# Patient Record
Sex: Female | Born: 1990 | Race: White | Hispanic: No | Marital: Married | Smoking: Never smoker
Health system: Southern US, Community
[De-identification: ages and names within clinical notes are randomized; demographics above are authoritative.]

## PROBLEM LIST (undated history)

## (undated) ENCOUNTER — Inpatient Hospital Stay (HOSPITAL_COMMUNITY): Payer: Self-pay

## (undated) DIAGNOSIS — F419 Anxiety disorder, unspecified: Secondary | ICD-10-CM

## (undated) DIAGNOSIS — E282 Polycystic ovarian syndrome: Secondary | ICD-10-CM

## (undated) DIAGNOSIS — H49819 Kearns-Sayre syndrome, unspecified eye: Secondary | ICD-10-CM

## (undated) DIAGNOSIS — E8840 Mitochondrial metabolism disorder, unspecified: Secondary | ICD-10-CM

## (undated) HISTORY — DX: Kearns-Sayre syndrome, unspecified eye: H49.819

## (undated) HISTORY — DX: Polycystic ovarian syndrome: E28.2

## (undated) HISTORY — PX: OTHER SURGICAL HISTORY: SHX169

## (undated) HISTORY — PX: WISDOM TOOTH EXTRACTION: SHX21

## (undated) HISTORY — DX: Mitochondrial metabolism disorder, unspecified: E88.40

## (undated) HISTORY — PX: TONSILLECTOMY: SUR1361

## (undated) HISTORY — DX: Anxiety disorder, unspecified: F41.9

---
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
## 2013-09-20 ENCOUNTER — Emergency Department (INDEPENDENT_AMBULATORY_CARE_PROVIDER_SITE_OTHER): Payer: 59

## 2013-09-20 ENCOUNTER — Encounter (HOSPITAL_COMMUNITY): Payer: Self-pay | Admitting: Emergency Medicine

## 2013-09-20 ENCOUNTER — Emergency Department (INDEPENDENT_AMBULATORY_CARE_PROVIDER_SITE_OTHER)
Admission: EM | Admit: 2013-09-20 | Discharge: 2013-09-20 | Disposition: A | Discharge status: Home / Self Care | Payer: 59 | Source: Home / Self Care | Attending: Family Medicine | Admitting: Family Medicine

## 2013-09-20 DIAGNOSIS — IMO0002 Reserved for concepts with insufficient information to code with codable children: Secondary | ICD-10-CM

## 2013-09-20 DIAGNOSIS — Y93K1 Activity, walking an animal: Secondary | ICD-10-CM

## 2013-09-20 DIAGNOSIS — S6000XA Contusion of unspecified finger without damage to nail, initial encounter: Secondary | ICD-10-CM

## 2013-09-20 MED ORDER — TETANUS-DIPHTHERIA TOXOIDS TD 5-2 LFU IM INJ
INJECTION | INTRAMUSCULAR | Status: AC
  Filled 2013-09-20: qty 0.5

## 2013-09-20 MED ORDER — TETANUS-DIPHTHERIA TOXOIDS TD 5-2 LFU IM INJ
0.5000 mL | INJECTION | Freq: Once | INTRAMUSCULAR | Status: AC
  Administered 2013-09-20: 0.5 mL via INTRAMUSCULAR

## 2013-09-20 NOTE — Discharge Instructions (Signed)
Wash regularly, tape to adjacent finger for comfort , return as needed.

## 2013-09-20 NOTE — ED Provider Notes (Signed)
CSN: [id number]     Arrival date & time 09/20/13  1406 History   First MD Initiated Contact with Patient 09/20/13 1522     Chief Complaint  Patient presents with  . Finger Injury   (Consider location/radiation/quality/duration/timing/severity/associated sxs/prior Treatment) Patient is a 23 y.o. female presenting with hand pain. The history is provided by the patient.  Hand Pain This is a new problem. The current episode started 2 days ago (pt's dog ran her into a brick wall on sat, continues with pain and sts.). The problem has been gradually worsening.    History reviewed. No pertinent past medical history. History reviewed. No pertinent past surgical history. History reviewed. No pertinent family history. History  Substance Use Topics  . Smoking status: Never Smoker   . Smokeless tobacco: Not on file  . Alcohol Use: Yes   OB History   Grav Para Term Preterm Abortions TAB SAB Ect Mult Living                 Review of Systems  Constitutional: Negative.   Musculoskeletal: Positive for joint swelling.  Skin: Positive for wound.    Allergies  Pertussis vaccines  Home Medications   Prior to Admission medications   Medication Sig Start Date End Date Taking? Authorizing Provider  FLUoxetine (PROZAC) 40 MG capsule Take 40 mg by mouth daily.   Yes Historical Provider, MD   BP 112/76  Pulse 66  Temp(Src) 98.2 F (36.8 C) (Oral)  Resp 16  SpO2 98%  LMP 09/07/2013 Physical Exam  Nursing note and vitals reviewed. Constitutional: She is oriented to person, place, and time. She appears well-developed and well-nourished.  Neurological: She is alert and oriented to person, place, and time.  Skin: Skin is warm and dry.  Superficial abrasion over dorsum of finger , ulnar dev and pain on rom.    ED Course  Procedures (including critical care time) Labs Review Labs Reviewed - No data to display  Imaging Review Dg Finger Little Right  09/20/2013   CLINICAL DATA:  Finger  pain/swelling  EXAM: RIGHT LITTLE FINGER 2+V  COMPARISON:  None.  FINDINGS: No fracture or dislocation is seen.  The joint spaces are preserved.  The visualized soft tissues are unremarkable.  IMPRESSION: No fracture or dislocation is seen.   Electronically Signed   By: Charline BillsSriyesh  Krishnan M.D.   On: 09/20/2013 16:06    X-rays reviewed and report per radiologist.  MDM   1. Contusion of finger of right hand        Linna HoffJames D Latara Micheli, MD 09/20/13 2143

## 2013-09-20 NOTE — ED Notes (Signed)
Injury to right fifth finger on Saturday, when her dog on leash ran her into a brick wall. Concern for fracture, as she has limited ROM

## 2013-10-26 ENCOUNTER — Emergency Department (HOSPITAL_COMMUNITY): Payer: 59

## 2013-10-26 ENCOUNTER — Emergency Department (HOSPITAL_COMMUNITY)
Admission: EM | Admit: 2013-10-26 | Discharge: 2013-10-26 | Disposition: A | Discharge status: Home / Self Care | Payer: 59 | Source: Home / Self Care | Attending: Emergency Medicine | Admitting: Emergency Medicine

## 2013-10-26 ENCOUNTER — Encounter (HOSPITAL_COMMUNITY): Payer: Self-pay | Admitting: Emergency Medicine

## 2013-10-26 DIAGNOSIS — J020 Streptococcal pharyngitis: Secondary | ICD-10-CM

## 2013-10-26 LAB — POCT URINALYSIS DIP (DEVICE)
Bilirubin Urine: NEGATIVE
Glucose, UA: NEGATIVE mg/dL
Hgb urine dipstick: NEGATIVE
KETONES UR: NEGATIVE mg/dL
Leukocytes, UA: NEGATIVE
Nitrite: NEGATIVE
PH: 7 (ref 5.0–8.0)
Protein, ur: NEGATIVE mg/dL
SPECIFIC GRAVITY, URINE: 1.02 (ref 1.005–1.030)
Urobilinogen, UA: 0.2 mg/dL (ref 0.0–1.0)

## 2013-10-26 LAB — POCT INFECTIOUS MONO SCREEN: Mono Screen: NEGATIVE

## 2013-10-26 LAB — POCT RAPID STREP A: STREPTOCOCCUS, GROUP A SCREEN (DIRECT): POSITIVE — AB

## 2013-10-26 LAB — POCT PREGNANCY, URINE: Preg Test, Ur: NEGATIVE

## 2013-10-26 MED ORDER — PREDNISONE 20 MG PO TABS: 20.0000 mg | ORAL_TABLET | Freq: Two times a day (BID) | ORAL | Status: DC

## 2013-10-26 MED ORDER — AZITHROMYCIN 500 MG PO TABS: 500.0000 mg | ORAL_TABLET | Freq: Every day | ORAL | Status: DC

## 2013-10-26 NOTE — ED Notes (Signed)
Pt c/o cold sx onset 1 week Sx include: fatigue, BA, nauseas, ST, chest tightness/congestion, HA Denies f/v/d Alert w/no signs of acute distress

## 2013-10-26 NOTE — Discharge Instructions (Signed)
Strep Throat  Strep throat is an infection of the throat caused by a bacteria named Streptococcus pyogenes. Your caregiver may call the infection streptococcal "tonsillitis" or "pharyngitis" depending on whether there are signs of inflammation in the tonsils or back of the throat. Strep throat is most common in children aged 23 15 years during the cold months of the year, but it can occur in people of any age during any season. This infection is spread from person to person (contagious) through coughing, sneezing, or other close contact.  SYMPTOMS   · Fever or chills.  · Painful, swollen, red tonsils or throat.  · Pain or difficulty when swallowing.  · White or yellow spots on the tonsils or throat.  · Swollen, tender lymph nodes or "glands" of the neck or under the jaw.  · Red rash all over the body (rare).  DIAGNOSIS   Many different infections can cause the same symptoms. A test must be done to confirm the diagnosis so the right treatment can be given. A "rapid strep test" can help your caregiver make the diagnosis in a few minutes. If this test is not available, a light swab of the infected area can be used for a throat culture test. If a throat culture test is done, results are usually available in a day or two.  TREATMENT   Strep throat is treated with antibiotic medicine.  HOME CARE INSTRUCTIONS   · Gargle with 1 tsp of salt in 1 cup of warm water, 3 4 times per day or as needed for comfort.  · Family members who also have a sore throat or fever should be tested for strep throat and treated with antibiotics if they have the strep infection.  · Make sure everyone in your household washes their hands well.  · Do not share food, drinking cups, or personal items that could cause the infection to spread to others.  · You may need to eat a soft food diet until your sore throat gets better.  · Drink enough water and fluids to keep your urine clear or pale yellow. This will help prevent dehydration.  · Get plenty of  rest.  · Stay home from school, daycare, or work until you have been on antibiotics for 24 hours.  · Only take over-the-counter or prescription medicines for pain, discomfort, or fever as directed by your caregiver.  · If antibiotics are prescribed, take them as directed. Finish them even if you start to feel better.  SEEK MEDICAL CARE IF:   · The glands in your neck continue to enlarge.  · You develop a rash, cough, or earache.  · You cough up green, yellow-brown, or bloody sputum.  · You have pain or discomfort not controlled by medicines.  · Your problems seem to be getting worse rather than better.  SEEK IMMEDIATE MEDICAL CARE IF:   · You develop any new symptoms such as vomiting, severe headache, stiff or painful neck, chest pain, shortness of breath, or trouble swallowing.  · You develop severe throat pain, drooling, or changes in your voice.  · You develop swelling of the neck, or the skin on the neck becomes red and tender.  · You have a fever.  · You develop signs of dehydration, such as fatigue, dry mouth, and decreased urination.  · You become increasingly sleepy, or you cannot wake up completely.  Document Released: 05/03/2000 Document Revised: 04/22/2012 Document Reviewed: 07/05/2010  [employer]® Patient Information ©2014 [employer].

## 2013-10-26 NOTE — ED Provider Notes (Signed)
Chief Complaint   Chief Complaint  Patient presents with  . URI    History of Present Illness   Sheryl Wright is a 23 year old female who's been sick for the past week feeling achy all over, tired, chills, and dizzy. She's had headaches and migraines, and she's felt nauseated and had a sore throat. Her last menstrual period was May 25th. She took a pregnancy test at home which was positive, but then went to her gynecologist yesterday had a negative serum pregnancy test. She's had nasal congestion with greenish drainage, chest feels tight and she's been short of breath. She's had a slight cough with clear to yellow drainage, chest tightness, crampy abdominal pain, and some diarrhea. Her husband has Mono.  Review of Systems   Other than as noted above, the patient denies any of the following symptoms: Systemic:  No fevers, chills, sweats, or myalgias. Eye:  No redness or discharge. ENT:  No ear pain, headache, nasal congestion, drainage, sinus pressure, or sore throat. Neck:  No neck pain, stiffness, or swollen glands. Lungs:  No cough, sputum production, hemoptysis, wheezing, chest tightness, shortness of breath or chest pain. GI:  No abdominal pain, nausea, vomiting or diarrhea.  PMFSH   Past medical history, family history, social history, meds, and allergies were reviewed. She is allergic to penicillin and pertussis vaccine. She takes Prozac.  Physical exam   Vital signs:  BP 111/69  Pulse 92  Temp(Src) 98.4 F (36.9 C) (Oral)  Resp 16  SpO2 98%  LMP 10/11/2013 General:  Alert and oriented.  In no distress.  Skin warm and dry. Eye:  No conjunctival injection or drainage. Lids were normal. ENT:  TMs and canals were normal, without erythema or inflammation.  Nasal mucosa was clear and uncongested, without drainage.  Mucous membranes were moist.  Pharynx was erythematous with some yellowish exudate on the tonsillar pillars. Her tonsils are surgically absent.  There were no  oral ulcerations or lesions. Neck:  Supple, no adenopathy, tenderness or mass. Lungs:  No respiratory distress.  Lungs were clear to auscultation, without wheezes, rales or rhonchi.  Breath sounds were clear and equal bilaterally.  Heart:  Regular rhythm, without gallops, murmers or rubs. Skin:  Clear, warm, and dry, without rash or lesions.  Labs   Results for orders placed during the hospital encounter of 10/26/13  POCT URINALYSIS DIP (DEVICE)      Result Value Ref Range   Glucose, UA NEGATIVE  NEGATIVE mg/dL   Bilirubin Urine NEGATIVE  NEGATIVE   Ketones, ur NEGATIVE  NEGATIVE mg/dL   Specific Gravity, Urine 1.020  1.005 - 1.030   Hgb urine dipstick NEGATIVE  NEGATIVE   pH 7.0  5.0 - 8.0   Protein, ur NEGATIVE  NEGATIVE mg/dL   Urobilinogen, UA 0.2  0.0 - 1.0 mg/dL   Nitrite NEGATIVE  NEGATIVE   Leukocytes, UA NEGATIVE  NEGATIVE  POCT PREGNANCY, URINE      Result Value Ref Range   Preg Test, Ur NEGATIVE  NEGATIVE  POCT RAPID STREP A ([facility]ARE ONLY)      Result Value Ref Range   Streptococcus, Group A Screen (Direct) POSITIVE (*) NEGATIVE  POCT INFECTIOUS MONO SCREEN      Result Value Ref Range   Mono Screen NEGATIVE  NEGATIVE    Assessment     The encounter diagnosis was Strep throat.  Plan    1.  Meds:  The following meds were prescribed:   New Prescriptions  AZITHROMYCIN (ZITHROMAX) 500 MG TABLET    Take 1 tablet (500 mg total) by mouth daily.   PREDNISONE (DELTASONE) 20 MG TABLET    Take 1 tablet (20 mg total) by mouth 2 (two) times daily.    2.  Patient Education/Counseling:  The patient was given appropriate handouts, self care instructions, and instructed in symptomatic relief.  Instructed to get extra fluids, rest, and use a cool mist vaporizer.    3.  Follow up:  The patient was told to follow up here if no better in 3 to 4 days, or sooner if becoming worse in any way, and given some red flag symptoms such as increasing fever, difficulty breathing, chest  pain, or persistent vomiting which would prompt immediate return.  Follow up here as needed.      Reuben Likes, MD 10/26/13 1053

## 2013-11-01 ENCOUNTER — Encounter (HOSPITAL_COMMUNITY): Payer: Self-pay | Admitting: Emergency Medicine

## 2013-11-01 ENCOUNTER — Emergency Department (INDEPENDENT_AMBULATORY_CARE_PROVIDER_SITE_OTHER)
Admission: EM | Admit: 2013-11-01 | Discharge: 2013-11-01 | Disposition: A | Discharge status: Home / Self Care | Payer: 59 | Source: Home / Self Care | Attending: Family Medicine | Admitting: Family Medicine

## 2013-11-01 DIAGNOSIS — R5381 Other malaise: Secondary | ICD-10-CM

## 2013-11-01 DIAGNOSIS — R5383 Other fatigue: Secondary | ICD-10-CM

## 2013-11-01 LAB — POCT URINALYSIS DIP (DEVICE)
Bilirubin Urine: NEGATIVE
GLUCOSE, UA: NEGATIVE mg/dL
Hgb urine dipstick: NEGATIVE
KETONES UR: NEGATIVE mg/dL
Nitrite: NEGATIVE
Protein, ur: NEGATIVE mg/dL
SPECIFIC GRAVITY, URINE: 1.02 (ref 1.005–1.030)
UROBILINOGEN UA: 0.2 mg/dL (ref 0.0–1.0)
pH: 6 (ref 5.0–8.0)

## 2013-11-01 LAB — POCT I-STAT, CHEM 8
BUN: 15 mg/dL (ref 6–23)
CALCIUM ION: 1.23 mmol/L (ref 1.12–1.23)
CHLORIDE: 99 meq/L (ref 96–112)
Creatinine, Ser: 0.9 mg/dL (ref 0.50–1.10)
Glucose, Bld: 85 mg/dL (ref 70–99)
HEMATOCRIT: 41 % (ref 36.0–46.0)
Hemoglobin: 13.9 g/dL (ref 12.0–15.0)
POTASSIUM: 4.2 meq/L (ref 3.7–5.3)
Sodium: 141 mEq/L (ref 137–147)
TCO2: 28 mmol/L (ref 0–100)

## 2013-11-01 LAB — POCT INFECTIOUS MONO SCREEN: MONO SCREEN: NEGATIVE

## 2013-11-01 LAB — POCT PREGNANCY, URINE: Preg Test, Ur: NEGATIVE

## 2013-11-01 NOTE — ED Notes (Signed)
C/o fatigue and upper body ache States she did have strep last Wednesday Tried to sleep more and used ibuprofen as tx

## 2013-11-01 NOTE — ED Provider Notes (Signed)
CSN: [id number]     Arrival date & time 11/01/13  1553 History   First MD Initiated Contact with Patient 11/01/13 1557     Chief Complaint  Patient presents with  . Fatigue   (Consider location/radiation/quality/duration/timing/severity/associated sxs/prior Treatment) HPI Comments: Patient presents with a 2 week history of fatigue and myalgias. States she was treated with azithromycin last week for strep pharyngitis. States her skin feels sore to the touch but that she has no rashes.  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    History reviewed. No pertinent past medical history. History reviewed. No pertinent past surgical history. History reviewed. No pertinent family history. History  Substance Use Topics  . Smoking status: Never Smoker   . Smokeless tobacco: Not on file  . Alcohol Use: Yes   OB History   Grav Para Term Preterm Abortions TAB SAB Ect Mult Living                 Review of Systems  Constitutional: Positive for fatigue. Negative for fever, chills, diaphoresis, activity change, appetite change and unexpected weight change.  HENT: Negative.   Eyes: Negative.   Respiratory: Negative.   Cardiovascular: Negative.   Gastrointestinal: Negative.   Endocrine: Negative for cold intolerance, heat intolerance, polydipsia, polyphagia and polyuria.  Genitourinary: Negative.   Musculoskeletal: Positive for myalgias. Negative for arthralgias, back pain, gait problem, joint swelling, neck pain and neck stiffness.  Skin: Negative.   Allergic/Immunologic: Negative.   Neurological: Negative.   Hematological: Negative for adenopathy. Does not bruise/bleed easily.  Psychiatric/Behavioral: Negative.     Allergies  Pertussis vaccines and Penicillins  Home Medications   Prior to Admission medications   Medication Sig Start Date End Date Taking? Authorizing Provider  azithromycin (ZITHROMAX) 500 MG tablet Take 1 tablet  (500 mg total) by mouth daily. 10/26/13   Reuben Likesavid C Keller, MD  FLUoxetine (PROZAC) 40 MG capsule Take 40 mg by mouth daily.    Historical Provider, MD  predniSONE (DELTASONE) 20 MG tablet Take 1 tablet (20 mg total) by mouth 2 (two) times daily. 10/26/13   Reuben Likesavid C Keller, MD   BP 102/71  Pulse 75  Temp(Src) 98.4 F (36.9 C) (Oral)  Resp 12  SpO2 96%  LMP 10/11/2013 Physical Exam  Nursing note and vitals reviewed. Constitutional: She is oriented to person, place, and time. She appears well-developed and well-nourished. No distress.  HENT:  Head: Normocephalic and atraumatic.  Mouth/Throat: Uvula is midline, oropharynx is clear and moist and mucous membranes are normal.  Eyes: Conjunctivae and EOM are normal. Pupils are equal, round, and reactive to light. Right eye exhibits no discharge. Left eye exhibits no discharge. No scleral icterus.  Neck: Normal range of motion. Neck supple. No thyromegaly present.  Cardiovascular: Normal rate, regular rhythm and normal heart sounds.   Pulmonary/Chest: Effort normal and breath sounds normal. No respiratory distress. She has no wheezes.  Abdominal: Soft. Bowel sounds are normal. She exhibits no distension and no mass. There is no tenderness.  Musculoskeletal: Normal range of motion. She exhibits no edema and no tenderness.  Lymphadenopathy:    She has no cervical adenopathy.  Neurological: She is alert and oriented to person, place, and time. She has normal strength. No cranial nerve deficit or sensory deficit. Coordination and gait normal. GCS eye subscore is 4. GCS verbal subscore is 5. GCS motor subscore is 6.  Psychiatric: She has a normal mood and affect.  Her behavior is normal.    ED Course  Procedures (including critical care time) Labs Review Labs Reviewed  POCT URINALYSIS DIP (DEVICE) - Abnormal; Notable for the following:    Leukocytes, UA SMALL (*)    All other components within normal limits  POCT PREGNANCY, URINE  POCT INFECTIOUS  MONO SCREEN  POCT I-STAT, CHEM 8    Imaging Review No results found.   MDM   1. Fatigue    Monospot negative. Vital signs normal. Patient appears medically and neurologically stable.  UA normal. UPT negative I-Stat 8 normal Recommended patient follow up with PCP of her choice for further investigation and thyroid evaluation.    Jess BartersJennifer Lee [place], [place] 11/01/13 [phone]

## 2013-11-01 NOTE — Discharge Instructions (Signed)
Your repeat monospot was negative. Your blood chemistry and hemoglobin were normal. Your urine studies were normal and your pregnancy test was negative. I would encourage you to follow up at one of the primary care facilities listed on your discharge paperwork for continued evaluation. You may wish to suggests that they perform thyroid function studies.  Fatigue Fatigue is a feeling of tiredness, lack of energy, lack of motivation, or feeling tired all the time. Having enough rest, good nutrition, and reducing stress will normally reduce fatigue. Consult your caregiver if it persists. The nature of your fatigue will help your caregiver to find out its cause. The treatment is based on the cause.  CAUSES  There are many causes for fatigue. Most of the time, fatigue can be traced to one or more of your habits or routines. Most causes fit into one or more of three general areas. They are: Lifestyle problems  Sleep disturbances.  Overwork.  Physical exertion.  Unhealthy habits.  Poor eating habits or eating disorders.  Alcohol and/or drug use .  Lack of proper nutrition (malnutrition). Psychological problems  Stress and/or anxiety problems.  Depression.  Grief.  Boredom. Medical Problems or Conditions  Anemia.  Pregnancy.  Thyroid gland problems.  Recovery from major surgery.  Continuous pain.  Emphysema or asthma that is not well controlled  Allergic conditions.  Diabetes.  Infections (such as mononucleosis).  Obesity.  Sleep disorders, such as sleep apnea.  Heart failure or other heart-related problems.  Cancer.  Kidney disease.  Liver disease.  Effects of certain medicines such as antihistamines, cough and cold remedies, prescription pain medicines, heart and blood pressure medicines, drugs used for treatment of cancer, and some antidepressants. SYMPTOMS  The symptoms of fatigue include:   Lack of energy.  Lack of drive  (motivation).  Drowsiness.  Feeling of indifference to the surroundings. DIAGNOSIS  The details of how you feel help guide your caregiver in finding out what is causing the fatigue. You will be asked about your present and past health condition. It is important to review all medicines that you take, including prescription and non-prescription items. A thorough exam will be done. You will be questioned about your feelings, habits, and normal lifestyle. Your caregiver may suggest blood tests, urine tests, or other tests to look for common medical causes of fatigue.  TREATMENT  Fatigue is treated by correcting the underlying cause. For example, if you have continuous pain or depression, treating these causes will improve how you feel. Similarly, adjusting the dose of certain medicines will help in reducing fatigue.  HOME CARE INSTRUCTIONS   Try to get the required amount of good sleep every night.  Eat a healthy and nutritious diet, and drink enough water throughout the day.  Practice ways of relaxing (including yoga or meditation).  Exercise regularly.  Make plans to change situations that cause stress. Act on those plans so that stresses decrease over time. Keep your work and personal routine reasonable.  Avoid street drugs and minimize use of alcohol.  Start taking a daily multivitamin after consulting your caregiver. SEEK MEDICAL CARE IF:   You have persistent tiredness, which cannot be accounted for.  You have fever.  You have unintentional weight loss.  You have headaches.  You have disturbed sleep throughout the night.  You are feeling sad.  You have constipation.  You have dry skin.  You have gained weight.  You are taking any new or different medicines that you suspect are causing fatigue.  You are unable to sleep at night.  You develop any unusual swelling of your legs or other parts of your body. SEEK IMMEDIATE MEDICAL CARE IF:   You are feeling  confused.  Your vision is blurred.  You feel faint or pass out.  You develop severe headache.  You develop severe abdominal, pelvic, or back pain.  You develop chest pain, shortness of breath, or an irregular or fast heartbeat.  You are unable to pass a normal amount of urine.  You develop abnormal bleeding such as bleeding from the rectum or you vomit blood.  You have thoughts about harming yourself or committing suicide.  You are worried that you might harm someone else. MAKE SURE YOU:   Understand these instructions.  Will watch your condition.  Will get help right away if you are not doing well or get worse. Document Released: 03/03/2007 Document Revised: 07/29/2011 Document Reviewed: 03/03/2007 [facility] Patient Information 2014 [employer], [place].

## 2013-11-02 ENCOUNTER — Ambulatory Visit (INDEPENDENT_AMBULATORY_CARE_PROVIDER_SITE_OTHER): Payer: 59 | Admitting: Family Medicine

## 2013-11-02 VITALS — BP 96/68 | HR 80 | Temp 98.2°F | Resp 18 | Ht 65.5 in | Wt 147.4 lb

## 2013-11-02 DIAGNOSIS — R209 Unspecified disturbances of skin sensation: Secondary | ICD-10-CM

## 2013-11-02 DIAGNOSIS — R599 Enlarged lymph nodes, unspecified: Secondary | ICD-10-CM

## 2013-11-02 DIAGNOSIS — R1011 Right upper quadrant pain: Secondary | ICD-10-CM

## 2013-11-02 DIAGNOSIS — R5383 Other fatigue: Secondary | ICD-10-CM

## 2013-11-02 DIAGNOSIS — R5381 Other malaise: Secondary | ICD-10-CM

## 2013-11-02 DIAGNOSIS — R590 Localized enlarged lymph nodes: Secondary | ICD-10-CM

## 2013-11-02 DIAGNOSIS — R203 Hyperesthesia: Secondary | ICD-10-CM

## 2013-11-02 LAB — HEPATIC FUNCTION PANEL
ALK PHOS: 62 U/L (ref 39–117)
ALT: 10 U/L (ref 0–35)
AST: 15 U/L (ref 0–37)
Albumin: 4.4 g/dL (ref 3.5–5.2)
BILIRUBIN DIRECT: 0.1 mg/dL (ref 0.0–0.3)
BILIRUBIN INDIRECT: 0.2 mg/dL (ref 0.2–1.2)
BILIRUBIN TOTAL: 0.3 mg/dL (ref 0.2–1.2)
Total Protein: 7.3 g/dL (ref 6.0–8.3)

## 2013-11-02 LAB — POCT CBC
GRANULOCYTE PERCENT: 57 % (ref 37–80)
HEMATOCRIT: 40.2 % (ref 37.7–47.9)
HEMOGLOBIN: 13 g/dL (ref 12.2–16.2)
Lymph, poc: 4.1 — AB (ref 0.6–3.4)
MCH, POC: 29.8 pg (ref 27–31.2)
MCHC: 32.3 g/dL (ref 31.8–35.4)
MCV: 92.2 fL (ref 80–97)
MID (cbc): 1.1 — AB (ref 0–0.9)
MPV: 9.6 fL (ref 0–99.8)
POC GRANULOCYTE: 7 — AB (ref 2–6.9)
POC LYMPH %: 33.9 % (ref 10–50)
POC MID %: 9.1 %M (ref 0–12)
Platelet Count, POC: 349 10*3/uL (ref 142–424)
RBC: 4.36 M/uL (ref 4.04–5.48)
RDW, POC: 14 %
WBC: 12.2 10*3/uL — AB (ref 4.6–10.2)

## 2013-11-02 LAB — C-REACTIVE PROTEIN

## 2013-11-02 LAB — POCT SEDIMENTATION RATE: POCT SED RATE: 18 mm/h (ref 0–22)

## 2013-11-02 NOTE — Progress Notes (Addendum)
Subjective:  This chart was scribed for Sheryl SorensonEva Shaw, MD, by Sheryl Wright, ED Scribe. This patient's care was started at 4:44 PM.    Patient ID: Sheryl Wright, female    DOB: May 25, 1990, 23 y.o.   MRN: [id number]  Chief Complaint  Patient presents with  . Fatigue    x 2 weeks    HPI  Sheryl DoveKristi E Wright is a 23 y.o. female who [facility] complaining of fatigue which has persisted for approximately two weeks. She also endorses the an aching sensation "like a sunburn" and hyperesthesia to bilateral upper arms, neck, and flank. Additionally, she endorses lightheadedness, though she denies the sense of the room spinning. She also endorses shakiness; she states the shakiness normally is a mental feeling, but yesterday as she was taking a picture, she experienced tremors to her hands.   She was treated at an urgent care for fatigue yesterday where she had a UA and pregnancy, both of which was negative.   She had strep throat diagnosed six days ago which has been treated and improved. The pt had her tonsils and adenoids removed.   Furthermore, she endorses nausea several weeks ago which has resolved. She also had a rash to her buttocks several weeks ago which has since resolved spontaneously.  She denies vaginal discharge beyond the baseline. She also denies diarrhea and constipation. Additionally, she denies emesis or appetite changes.   She denies known tick bites or insect bites, though she found a tick in her bed recently.  She denies using vitamin supplements.   Sheryl Wright also endorses a h/o anxiety and OCD which increased four months ago. She is treating both with Prozac and counseling; her [facility] and her counselor [facility].  She is not taking oral contraceptives.   She reports allergies to penicillin, the pertussis vaccine, and adhesive bandages.   Past Medical History  Diagnosis Date  . Anxiety    Current Outpatient  Prescriptions on File Prior to Visit  Medication Sig Dispense Refill  . FLUoxetine (PROZAC) 40 MG capsule Take 40 mg by mouth daily.       No current facility-administered medications on file prior to visit.   Allergies  Allergen Reactions  . Pertussis Vaccines Hives  . Penicillins   . Tape     Review of Systems  Constitutional: Negative for fever and chills.  HENT: Positive for sore throat (Resolved).   Gastrointestinal: Positive for nausea (Resolved). Negative for vomiting, diarrhea and constipation.  Genitourinary: Negative for vaginal discharge.  Musculoskeletal: Positive for myalgias.  Skin: Positive for rash.  Neurological: Positive for tremors and light-headedness.  Psychiatric/Behavioral: The patient is nervous/anxious.     Triage Vitals: BP 96/68  Pulse 80  Temp(Src) 98.2 F (36.8 C) (Oral)  Resp 18  Ht 5' 5.5" (1.664 m)  Wt 147 lb 6.4 oz (66.86 kg)  BMI 24.15 kg/m2  SpO2 98%  LMP 10/11/2013     Objective:   Physical Exam  Nursing note and vitals reviewed. Constitutional: She is oriented to person, place, and time. She appears well-developed and well-nourished. No distress.  HENT:  Head: Normocephalic and atraumatic.  Right Ear: Tympanic membrane normal. No tenderness. Tympanic membrane is not injected, not erythematous, not retracted and not bulging. No middle ear effusion.  Left Ear: Tympanic membrane normal. No tenderness. Tympanic membrane is not injected, not erythematous, not retracted and not bulging.  No middle ear effusion.  Nose: Mucosal edema (  Left nasal. Right normal. No erythema. ) present.  Mouth/Throat: Oropharynx is clear and moist. No oropharyngeal exudate, posterior oropharyngeal edema or posterior oropharyngeal erythema.  Eyes: Conjunctivae and EOM are normal.  Neck: Neck supple. No tracheal deviation present. No thyromegaly present.  Cardiovascular: Normal rate, regular rhythm, S1 normal and S2 normal.   Pulmonary/Chest: Effort normal and  breath sounds normal. No respiratory distress. She has no decreased breath sounds. She has no wheezes. She has no rhonchi. She has no rales.  Abdominal: Bowel sounds are normal. There is tenderness.  Generalized tenderness with palpation over the liver.   Musculoskeletal: Normal range of motion.  Lymphadenopathy:       Head (right side): No submental, no submandibular, no preauricular and no posterior auricular adenopathy present.       Head (left side): No submental, no submandibular, no preauricular and no posterior auricular adenopathy present.    She has cervical adenopathy.       Right cervical: Superficial cervical adenopathy present. No posterior cervical adenopathy present.      Left cervical: No posterior cervical adenopathy present.    She has no axillary adenopathy.  Neurological: She is alert and oriented to person, place, and time.  Skin: Skin is warm and dry. Rash noted. Rash is macular.  Very poorly defined very mild blanching macular erythema and hyperesthesia over lateral upper arms and upper back w/ decreased cap refill of 3-4 sec. No rash on palms or soles.  Psychiatric: She has a normal mood and affect. Her behavior is normal.   Results for orders placed in visit on 11/02/13  POCT CBC      Result Value Ref Range   WBC 12.2 (*) 4.6 - 10.2 K/uL   Lymph, poc 4.1 (*) 0.6 - 3.4   POC LYMPH PERCENT 33.9  10 - 50 %L   MID (cbc) 1.1 (*) 0 - 0.9   POC MID % 9.1  0 - 12 %M   POC Granulocyte 7.0 (*) 2 - 6.9   Granulocyte percent 57.0  37 - 80 %G   RBC 4.36  4.04 - 5.48 M/uL   Hemoglobin 13.0  12.2 - 16.2 g/dL   HCT, POC 29.540.2  62.137.7 - 47.9 %   MCV 92.2  80 - 97 fL   MCH, POC 29.8  27 - 31.2 pg   MCHC 32.3  31.8 - 35.4 g/dL   RDW, POC 30.814.0     Platelet Count, POC 349  142 - 424 K/uL   MPV 9.6  0 - 99.8 fL       Assessment & Plan:   Fatigue - Plan: C-reactive protein, POCT CBC, POCT SEDIMENTATION RATE, TSH, Hepatic Function Panel, Epstein-Barr virus VCA antibody panel,  Antistreptolysin O titer - unknown etiology - pt recovering from strep throat 1 wk prev treated w/ azithro 500 qd x 5d but fatigue predated that. Was diagnosed on rapid strep only so confirm w/ aso titers and check labs.  If return negative/normal - may need to do additional labs at f/u in 2d - rec repeat cbc, and cons additional viral labs like cmv, hiv at f/u.  No signs of staph/strep scalded skin syndrome (though sunburn like pain unusual), no rashes typical of RMSF or lyme at this time.  BP is low but ok for otherwise young health woman - however due to sxs of lightheadedness consider orthostatics if persists at f/u.  Adenopathy, cervical  RUQ abdominal pain  Hyperesthesia   I personally performed the  services described in this documentation, which was scribed in my presence. The recorded information has been reviewed and considered, and addended by me as needed.  Delman Cheadle, MD MPH

## 2013-11-02 NOTE — ED Provider Notes (Signed)
Medical screening examination/treatment/procedure(s) were performed by resident physician or non-physician practitioner and as supervising physician I was immediately available for consultation/collaboration.   Sona Nations DOUGLAS MD.   Keera Altidor D Aairah Negrette, MD 11/02/13 1716 

## 2013-11-03 LAB — EPSTEIN-BARR VIRUS VCA ANTIBODY PANEL
EBV EA IGG: 49 U/mL — AB (ref <9.0)
EBV NA IGG: 46 U/mL — AB (ref <18.0)
EBV VCA IGM: 49.8 U/mL — AB (ref <36.0)
EBV VCA IgG: 368 U/mL — ABNORMAL HIGH (ref <18.0)

## 2013-11-03 LAB — ANTISTREPTOLYSIN O TITER: ASO: 910 [IU]/mL — AB (ref <409)

## 2013-11-03 LAB — TSH: TSH: 2.095 u[IU]/mL (ref 0.350–4.500)

## 2013-11-04 ENCOUNTER — Ambulatory Visit (INDEPENDENT_AMBULATORY_CARE_PROVIDER_SITE_OTHER): Payer: 59 | Admitting: Family Medicine

## 2013-11-04 VITALS — BP 102/74 | HR 86 | Temp 99.0°F | Resp 16 | Ht 65.5 in | Wt 148.0 lb

## 2013-11-04 DIAGNOSIS — B2790 Infectious mononucleosis, unspecified without complication: Secondary | ICD-10-CM

## 2013-11-04 NOTE — Patient Instructions (Signed)
Infectious Mononucleosis  Infectious mononucleosis (mono) is a common germ (viral) infection in children, teenagers, and young adults.   CAUSES   Mono is an infection caused by the Epstein Barr virus. The virus is spread by close personal contact with someone who has the infection. It can be passed by contact with your saliva through things such as kissing or sharing drinking glasses. Sometimes, the infection can be spread from someone who does not appear sick but still spreads the virus (asymptomatic carrier state).   SYMPTOMS   The most common symptoms of Mono are:  · Sore throat.  · Headache.  · Fatigue.  · Muscle aches.  · Swollen glands.  · Fever.  · Poor appetite.  · Enlarged liver or spleen.  The less common symptoms can include:  · Rash.  · Feeling sick to your stomach (nauseous).  · Abdominal pain.  DIAGNOSIS   Mono is diagnosed by a blood test.   TREATMENT   Treatment of mono is usually at home. There is no medicine that cures this virus. Sometimes hospital treatment is needed in severe cases. Steroid medicine sometimes is needed if the swelling in the throat causes breathing or swallowing problems.   HOME CARE INSTRUCTIONS   · Drink enough fluids to keep your urine clear or pale yellow.  · Eat soft foods. Cool foods like popsicles or ice cream can soothe a sore throat.  · Only take over-the-counter or prescription medicines for pain, discomfort, or fever as directed by your caregiver. Children under 18 years of age should not take aspirin.  · Gargle salt water. This may help relieve your sore throat. Put 1 teaspoon (tsp) of salt in 1 cup of warm water. Sucking on hard candy may also help.  · Rest as needed.  · Start regular activities gradually after the fever is gone. Be sure to rest when tired.  · Avoid strenuous exercise or contact sports until your caregiver says it is okay. The liver and spleen could be seriously injured.  · Avoid sharing drinking glasses or kissing until your caregiver tells you  that you are no longer contagious.  SEEK MEDICAL CARE IF:   · Your fever is not gone after 7 days.  · Your activity level is not back to normal after 2 weeks.  · You have yellow coloring to eyes and skin (jaundice).  SEEK IMMEDIATE MEDICAL CARE IF:   · You have severe pain in the abdomen or shoulder.  · You have trouble swallowing or drooling.  · You have trouble breathing.  · You develop a stiff neck.  · You develop a severe headache.  · You cannot stop throwing up (vomiting).  · You have convulsions.  · You are confused.  · You have trouble with balance.  · You develop signs of body fluid loss (dehydration):  ¨ Weakness.  ¨ Sunken eyes.  ¨ Pale skin.  ¨ Dry mouth.  ¨ Rapid breathing or pulse.  MAKE SURE YOU:   · Understand these instructions.  · Will watch your condition.  · Will get help right away if you are not doing well or get worse.  Document Released: 05/03/2000 Document Revised: 07/29/2011 Document Reviewed: 03/01/2008  ExitCare® Patient Information ©2015 [employer]. This information is not intended to replace advice given to you by your health care provider. Make sure you discuss any questions you have with your health care provider.

## 2013-11-04 NOTE — Progress Notes (Signed)
Subjective:    Patient ID: Sheryl Wright, female    DOB: [birth year], 23 y.o.   MRN: [id number]   This chart wWynona Doveas scribed for Levell Julyva N. Clelia CroftShaw, MD by Chestine SporeSoijett Blue, ED Scribe. The patient was seen in room 13 at 9:06 AM.   Chief Complaint  Patient presents with  . Results    from last visit     HPI  HPI Comments: Sheryl Wright is a 23 y.o. female who [facility] for a follow up for her results of her last visit 2d prev. Her labs show that she did indeed have strep throat 1 wk ago (treated w/ azithro due to pcn allergy) but that she also has recently had infectious mononucleosis which explains severe and disabling fatigue which predated the more recent acute strep pharyngitis by 1 wk.   She states that her skin is still sensitive and sore. She states that her stomach is fine and her throat is still sore. She states that she is still fatigued and can barely stay awake. She states that she has worked about 15 less hours/wk at work because she is fatigued and she can barely do that. She states that she was recently married and has not been her full self since her honeymoon 1 mo ago.  Her husband had a very severe case of mono in college.     Past Medical History  Diagnosis Date  . Anxiety     Current Outpatient Prescriptions on File Prior to Visit  Medication Sig Dispense Refill  . FLUoxetine (PROZAC) 40 MG capsule Take 40 mg by mouth daily.       No current facility-administered medications on file prior to visit.    Allergies  Allergen Reactions  . Pertussis Vaccines Hives  . Penicillins   . Tape     Review of Systems  Constitutional: Positive for activity change and fatigue. Negative for fever, chills and diaphoresis.  HENT: Positive for sore throat. Negative for congestion, ear discharge, ear pain, mouth sores, nosebleeds, postnasal drip, rhinorrhea, sinus pressure, sneezing, trouble swallowing and voice change.   Eyes: Negative for pain and itching.  Respiratory: Negative  for cough and shortness of breath.   Cardiovascular: Negative for chest pain.  Gastrointestinal: Negative for nausea, vomiting, abdominal pain, diarrhea and constipation.  Genitourinary: Negative for dysuria.  Musculoskeletal: Positive for myalgias and neck pain. Negative for arthralgias, gait problem, joint swelling and neck stiffness.  Skin: Negative for color change, rash and wound.  Neurological: Negative for dizziness, syncope and headaches.  Hematological: Positive for adenopathy.  Psychiatric/Behavioral: Positive for sleep disturbance.       BP 102/74  Pulse 86  Temp(Src) 99 F (37.2 C) (Oral)  Resp 16  Ht 5' 5.5" (1.664 m)  Wt 148 lb (67.132 kg)  BMI 24.25 kg/m2  SpO2 99%  LMP 10/11/2013  Objective:   Physical Exam  Nursing note and vitals reviewed. Constitutional: She is oriented to person, place, and time. She appears well-developed and well-nourished. No distress.  HENT:  Head: Normocephalic and atraumatic.  Right Ear: Tympanic membrane normal.  Left Ear: Tympanic membrane normal.  Eyes: EOM are normal.  Neck: Neck supple. No tracheal deviation present.  Cardiovascular: Normal rate and regular rhythm.   Nl S1 and S2  Pulmonary/Chest: Effort normal and breath sounds normal. No respiratory distress.  Musculoskeletal: Normal range of motion.  Lymphadenopathy:       Head (right side): Tonsillar adenopathy present. No submandibular, no preauricular and no  posterior auricular adenopathy present.       Head (left side): Tonsillar adenopathy present. No submandibular, no preauricular and no posterior auricular adenopathy present.    She has cervical adenopathy (post anterior and anterior right worse than left. ).       Right cervical: Superficial cervical and posterior cervical adenopathy present.       Left cervical: Superficial cervical and posterior cervical adenopathy present.       Right: No supraclavicular adenopathy present.       Left: No supraclavicular  adenopathy present.  Neurological: She is alert and oriented to person, place, and time.  Skin: Skin is warm and dry.  Psychiatric: She has a normal mood and affect. Her behavior is normal.      Results for orders placed in visit on 11/02/13  C-REACTIVE PROTEIN      Result Value Ref Range   CRP <0.5  <0.60 mg/dL  TSH      Result Value Ref Range   TSH 2.095  0.350 - 4.500 uIU/mL  HEPATIC FUNCTION PANEL      Result Value Ref Range   Total Bilirubin 0.3  0.2 - 1.2 mg/dL   Bilirubin, Direct 0.1  0.0 - 0.3 mg/dL   Indirect Bilirubin 0.2  0.2 - 1.2 mg/dL   Alkaline Phosphatase 62  39 - 117 U/L   AST 15  0 - 37 U/L   ALT 10  0 - 35 U/L   Total Protein 7.3  6.0 - 8.3 g/dL   Albumin 4.4  3.5 - 5.2 g/dL  EPSTEIN-BARR VIRUS VCA ANTIBODY PANEL      Result Value Ref Range   EBV VCA IgG 368.0 (*) <18.0 U/mL   EBV VCA IgM 49.8 (*) <36.0 U/mL   EBV EA IgG 49.0 (*) <9.0 U/mL   EBV NA IgG 46.0 (*) <18.0 U/mL  ANTISTREPTOLYSIN O TITER      Result Value Ref Range   ASO 910 (*) <409 IU/mL  POCT CBC      Result Value Ref Range   WBC 12.2 (*) 4.6 - 10.2 K/uL   Lymph, poc 4.1 (*) 0.6 - 3.4   POC LYMPH PERCENT 33.9  10 - 50 %L   MID (cbc) 1.1 (*) 0 - 0.9   POC MID % 9.1  0 - 12 %M   POC Granulocyte 7.0 (*) 2 - 6.9   Granulocyte percent 57.0  37 - 80 %G   RBC 4.36  4.04 - 5.48 M/uL   Hemoglobin 13.0  12.2 - 16.2 g/dL   HCT, POC 16.140.2  09.637.7 - 47.9 %   MCV 92.2  80 - 97 fL   MCH, POC 29.8  27 - 31.2 pg   MCHC 32.3  31.8 - 35.4 g/dL   RDW, POC 04.514.0     Platelet Count, POC 349  142 - 424 K/uL   MPV 9.6  0 - 99.8 fL  POCT SEDIMENTATION RATE      Result Value Ref Range   POCT SED RATE 18  0 - 22 mm/hr    Assessment & Plan:  DIAGNOSTIC STUDIES: Oxygen Saturation is 99% on room air, normal by my interpretation.    COORDINATION OF CARE: 9:18 AM-Discussed treatment plan which includes a doctors note for work for a week from 11/15/13 - if pt is feeling much better and wants to go back to work  sooner ok to call and we will rewrite note. Ok to exercise but no contact sports  due to risk of hepatic or splenic rupture, no alcohol, and no second hand smoke with pt at bedside and pt agreed to plan. Get plenty of rest w/ supportive care. If not back to self in 1-2 wks, RTC for repeat eval.  Mononucleosis   I personally performed the services described in this documentation, which was scribed in my presence. The recorded information has been reviewed and considered, and addended by me as needed.  Norberto Sorenson, MD MPH

## 2013-11-09 ENCOUNTER — Ambulatory Visit (INDEPENDENT_AMBULATORY_CARE_PROVIDER_SITE_OTHER): Payer: 59 | Admitting: Physician Assistant

## 2013-11-09 VITALS — BP 102/70 | HR 87 | Temp 98.3°F | Resp 16 | Ht 65.5 in | Wt 148.8 lb

## 2013-11-09 DIAGNOSIS — B2790 Infectious mononucleosis, unspecified without complication: Secondary | ICD-10-CM

## 2013-11-09 LAB — COMPREHENSIVE METABOLIC PANEL
ALT: 16 U/L (ref 0–35)
AST: 17 U/L (ref 0–37)
Albumin: 4 g/dL (ref 3.5–5.2)
Alkaline Phosphatase: 59 U/L (ref 39–117)
BUN: 10 mg/dL (ref 6–23)
CO2: 27 mEq/L (ref 19–32)
Calcium: 9.2 mg/dL (ref 8.4–10.5)
Chloride: 102 mEq/L (ref 96–112)
Creat: 0.67 mg/dL (ref 0.50–1.10)
Glucose, Bld: 80 mg/dL (ref 70–99)
Potassium: 4 mEq/L (ref 3.5–5.3)
Sodium: 136 mEq/L (ref 135–145)
Total Bilirubin: 0.4 mg/dL (ref 0.2–1.2)
Total Protein: 6.9 g/dL (ref 6.0–8.3)

## 2013-11-09 NOTE — Progress Notes (Signed)
   Subjective:    Patient ID: Sheryl Wright, female    DOB: February 22, 1991, 23 y.o.   MRN: [id number]  HPI 23 year old female presents for evaluation for return to work.  She was diagnosed with mono and strep throat on 6/18.  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 Continues to have some generalized body aches and fatigue, but overall feels much better.  No fever, chills, nausea, vomiting, sore throat, otalgia, or cough.     Review of Systems  Constitutional: Positive for fatigue. Negative for fever and chills.  HENT: Negative for sore throat.   Respiratory: Negative for cough.   Gastrointestinal: Negative for nausea, vomiting and abdominal pain.  Neurological: Negative for headaches.       Objective:   Physical Exam  Constitutional: She is oriented to person, place, and time. She appears well-developed and well-nourished.  HENT:  Head: Normocephalic and atraumatic.  Right Ear: External ear normal.  Left Ear: External ear normal.  Eyes: Conjunctivae are normal.  Neck: Normal range of motion. Neck supple.  Cardiovascular: Normal rate, regular rhythm and normal heart sounds.   Pulmonary/Chest: Effort normal and breath sounds normal.  Abdominal: Soft. Bowel sounds are normal. There is no tenderness. There is no rebound, no guarding and no CVA tenderness.  Lymphadenopathy:    She has no cervical adenopathy.  Neurological: She is alert and oriented to person, place, and time.  Psychiatric: She has a normal mood and affect. Her behavior is normal. Judgment and thought content normal.          Assessment & Plan:  Infectious mononucleosis - Plan: Comprehensive metabolic panel  Mono- improving. Will draw CMET to ensure LFT's still stable Ok to return to work with light duty/desk work. Anticipate return to full duty on 6/29.  Follow up if symptoms worsening or fail to improve.

## 2013-12-06 ENCOUNTER — Ambulatory Visit (INDEPENDENT_AMBULATORY_CARE_PROVIDER_SITE_OTHER): Payer: 59 | Admitting: Emergency Medicine

## 2013-12-06 ENCOUNTER — Ambulatory Visit (INDEPENDENT_AMBULATORY_CARE_PROVIDER_SITE_OTHER): Payer: 59

## 2013-12-06 VITALS — BP 98/68 | HR 104 | Temp 99.1°F | Resp 16 | Ht 66.0 in | Wt 157.6 lb

## 2013-12-06 DIAGNOSIS — J3489 Other specified disorders of nose and nasal sinuses: Secondary | ICD-10-CM

## 2013-12-06 DIAGNOSIS — R141 Gas pain: Secondary | ICD-10-CM

## 2013-12-06 DIAGNOSIS — R140 Abdominal distension (gaseous): Secondary | ICD-10-CM

## 2013-12-06 DIAGNOSIS — R143 Flatulence: Secondary | ICD-10-CM

## 2013-12-06 DIAGNOSIS — R142 Eructation: Secondary | ICD-10-CM

## 2013-12-06 DIAGNOSIS — M545 Low back pain, unspecified: Secondary | ICD-10-CM

## 2013-12-06 DIAGNOSIS — E282 Polycystic ovarian syndrome: Secondary | ICD-10-CM | POA: Insufficient documentation

## 2013-12-06 DIAGNOSIS — R635 Abnormal weight gain: Secondary | ICD-10-CM

## 2013-12-06 DIAGNOSIS — R0981 Nasal congestion: Secondary | ICD-10-CM

## 2013-12-06 DIAGNOSIS — R509 Fever, unspecified: Secondary | ICD-10-CM

## 2013-12-06 LAB — CBC WITH DIFFERENTIAL/PLATELET
BASOS PCT: 0 % (ref 0–1)
Basophils Absolute: 0 10*3/uL (ref 0.0–0.1)
EOS ABS: 0.1 10*3/uL (ref 0.0–0.7)
Eosinophils Relative: 1 % (ref 0–5)
HCT: 38.5 % (ref 36.0–46.0)
Hemoglobin: 12.9 g/dL (ref 12.0–15.0)
Lymphocytes Relative: 13 % (ref 12–46)
Lymphs Abs: 1.1 10*3/uL (ref 0.7–4.0)
MCH: 29.8 pg (ref 26.0–34.0)
MCHC: 33.5 g/dL (ref 30.0–36.0)
MCV: 88.9 fL (ref 78.0–100.0)
MONOS PCT: 13 % — AB (ref 3–12)
Monocytes Absolute: 1.1 10*3/uL — ABNORMAL HIGH (ref 0.1–1.0)
NEUTROS PCT: 73 % (ref 43–77)
Neutro Abs: 6 10*3/uL (ref 1.7–7.7)
PLATELETS: 253 10*3/uL (ref 150–400)
RBC: 4.33 MIL/uL (ref 3.87–5.11)
RDW: 13.4 % (ref 11.5–15.5)
WBC: 8.2 10*3/uL (ref 4.0–10.5)

## 2013-12-06 LAB — POCT URINE PREGNANCY: Preg Test, Ur: NEGATIVE

## 2013-12-06 MED ORDER — IPRATROPIUM BROMIDE 0.06 % NA SOLN: 2.0000 | Freq: Four times a day (QID) | NASAL | Status: DC

## 2013-12-06 MED ORDER — DICYCLOMINE HCL 20 MG PO TABS: 20.0000 mg | ORAL_TABLET | Freq: Three times a day (TID) | ORAL | Status: DC

## 2013-12-06 NOTE — Patient Instructions (Addendum)
I recommend that you get Mucinex Maximum Strength to help thin the mucous draining in the back of your throat, which will be less irritating to your throat.  To help reduce constipation and promote bowel health, 1. Drink at least 64 ounces of water each day; 2. Eat plenty of fiber (fruits, vegetables, whole grains, legumes) 3. Get plenty of physical activity  If needed, use a stool softener (docusate) or an osmotic laxative (like Miralax) each day, or as needed.  HOW TO GET YOUR KID UNCLOGGED ASAP (copied from It's No Accident by Dr. Yetta FlockHodges, pp. [phone])  Initial Clean Out: Start this process on a Friday night, so you have the entire weekend to get the job done. For children lighter than 45 pounds, mix seven doses of MiraLAX or generic brand powder (one dose is a cap filled to the line) in 32 ounces of Gatorade, Pedialyte, or other clear, noncarbonated liquid. Gatorade and Pedialyte help prevent dehydration because they contain electrolytes.  Plus, they taste good, so kids may be more motivated to drink the full dose.  Have your child drink the entire bottle over twenty-four hours. She can eat anything she wants, but should drink only this fluid. Kids weighing between forty-five and eighty pounds should get ten doses, and those weighing at least eighty pounds should receive fourteen doses of laxative in 64 ounces of fluid over the first twenty-four hours.  Don't give her less than this amount! You may even need to give her more.  The endpoint you're shooting for is watery poop.  Your child should pass five or six stools within twenty-four to forty-eight hours.  If she doesn't, keep her on the maintenance dose described below and try the initial clean out program again the next weekend, keeping her on a daily capful of MiraLAX during the week.  Maintenance: Following the bowel clearing, give your child one cap of the laxative in 8 ounces of fluid daily, and have her sit on the toilet after all meals,  especially breakfast, using a footstool for support if necessary.  She should blow out while pooping, which will help her bottom muscles relax.

## 2013-12-06 NOTE — Progress Notes (Signed)
Subjective:    Patient ID: Sheryl Wright, female    DOB: 06/01/1990, 23 y.o.   MRN: [id number]   PCP: No PCP Per Patient  Chief Complaint  Patient presents with  . Back Pain    x 2 weeks  . Dizziness    x 2 weeks  . Fever    woke up with 100 temp    Medications, allergies, past medical history, surgical history, family history, social history and problem list reviewed and updated.  HPI  "I feel sick." "My head hurts.  Not right now, I took some medicine (ibuprofen)." "I'm congested." Nasal and sinus congestion and post-nasal drainage.  Had a sore throat yesterday, todays it's just dry and kind of scratchy.  No ear pressure or fullness. No cough. "I have a fever." This is the symptom that made her decide to get seen today.  "I get extremely bloated, no matter how much I eat.  I get pains, like gas pains."  Back pain, intermittent dizziness x about a month. The back pain is worse on the LEFT.  Generally not affected by position, but when she rolls over at night she gets a sharp shooting pain across the LEFT side. "I've gained a significant amount of weight in the past month or two without any change in my diet." Doesn't exercise.   "I just recently quit my Prozac." Mid-June. Difficulty sleeping.  "I thought Mono was supposed to make me sleep more."  Psychiatrist told her in February/March that 4-5 severe HA, associated with vomiting & phonophobia, could be migraine. Has a history of bad headaches, but not associated with vomiting until this year.  She's been ill since early June.  Her visits at [facility] on 6/09 and 6/15, and then here on 6/23 are reviewed.  She did have a positive rapid strep (and ASO titer) and after 2 negative mono spot tests, she had a positive EBV titer. CBC, CMET were normal.  TSH was normal.  She's frustrated by her illness in general.  In addition, she is a newlywed. Not currently using any form of contraception.  Uses a period tracker app, "we're  careful during that time." Does not want to be pregnant.   Review of Systems As above.  No urinary urgency, frequency, burning.  No hematuria. She notes reduced frequency of BMs, now once every 3 days.  Her most recent BM was extra hard and required straining. No hematochezia or melena.  No other muscle or joint aches. No visual disturbance.  Had some nausea this morning.    Objective:   Physical Exam  Vitals reviewed. Constitutional: She is oriented to person, place, and time. Vital signs are normal. She appears well-developed and well-nourished. She is active and cooperative. No distress.  BP 98/68  Pulse 104  Temp(Src) 99.1 F (37.3 C) (Oral)  Resp 16  Ht 5\' 6"  (1.676 m)  Wt 157 lb 9.6 oz (71.487 kg)  BMI 25.45 kg/m2  SpO2 97%  LMP 11/14/2013   HENT:  Head: Normocephalic and atraumatic.  Right Ear: Hearing, tympanic membrane, external ear and ear canal normal.  Left Ear: Hearing, tympanic membrane, external ear and ear canal normal.  Nose: Rhinorrhea present. No mucosal edema.  No foreign bodies. Right sinus exhibits no maxillary sinus tenderness and no frontal sinus tenderness. Left sinus exhibits no maxillary sinus tenderness and no frontal sinus tenderness.  Mouth/Throat: Uvula is midline, oropharynx is clear and moist and mucous membranes are normal. No uvula swelling.  No oropharyngeal exudate.  Eyes: Conjunctivae and EOM are normal. Pupils are equal, round, and reactive to light. Right eye exhibits no discharge. Left eye exhibits no discharge. No scleral icterus.  Neck: Trachea normal, normal range of motion and full passive range of motion without pain. Neck supple. No mass and no thyromegaly present.  Cardiovascular: Normal rate, regular rhythm and normal heart sounds.   Pulmonary/Chest: Effort normal and breath sounds normal.  Musculoskeletal:       Arms: Lymphadenopathy:       Head (right side): No submandibular, no tonsillar, no preauricular, no posterior auricular and  no occipital adenopathy present.       Head (left side): No submandibular, no tonsillar, no preauricular and no occipital adenopathy present.    She has no cervical adenopathy.       Right: No supraclavicular adenopathy present.       Left: No supraclavicular adenopathy present.  Neurological: She is alert and oriented to person, place, and time. She has normal strength. No cranial nerve deficit or sensory deficit.  Skin: Skin is warm, dry and intact. No rash noted.  Psychiatric: She has a normal mood and affect. Her speech is normal and behavior is normal.      Results for orders placed in visit on 12/06/13  POCT URINE PREGNANCY      Result Value Ref Range   Preg Test, Ur Negative     Acute Abdominal Series: [facility] reading (PRIMARY) by  Dr. Cleta Alberts.  Constipation. Unusual hypodense pattern in the RIGHT abdomen that could represent air. Otherwise normal.  STAT over-read requested.  Over-Read: Single view of the chest demonstrates clear lungs and normal heart  size. No pneumothorax or pleural effusion.  Two views of the abdomen show no free intraperitoneal air. The bowel  gas pattern is normal. No abnormal abdominal calcification is seen.  IMPRESSION:  Negative exam.       Assessment & Plan:  1. Fever, unspecified fever cause Low-grade.  Could be due to constipation. - CBC with Differential  2. Left-sided low back pain without sciatica Unclear etiology. Constipation can cause back pain.  OK to use ibuprofen or acetaminophen. - POCT urine pregnancy  3. Abdominal bloating Constipation.  Increase oral fluids, dietary fiber and physical activity. OTC Miralax. - DG Abd Acute W/Chest - dicyclomine (BENTYL) 20 MG tablet; Take 1 tablet (20 mg total) by mouth 4 (four) times daily -  before meals and at bedtime.  Dispense: 60 tablet; Refill: 0  4. Head congestion OTC Mucinex. Rest. Reminded her that some people take months to recover their previous energy after mononucleosis. This is  likely an unrelated viral URI, but she's more susceptible during her recovery. - ipratropium (ATROVENT) 0.06 % nasal spray; Place 2 sprays into both nostrils 4 (four) times daily.  Dispense: 15 mL; Refill: 0  5. Weight gain Suspect due to large stool volume and decrease in physical activity with recent illness.  TSH was normal.  Once her bowels are moving better, we'll reassess this issue.   Fernande Bras, PA-C Physician Assistant-Certified [facility]

## 2013-12-06 NOTE — Addendum Note (Signed)
Addended by: Fernande BrasJEFFERY, Ewin Rehberg S on: 12/06/2013 07:08 PM   Modules accepted: Level of Service

## 2013-12-09 ENCOUNTER — Telehealth: Payer: Self-pay | Admitting: *Deleted

## 2013-12-09 NOTE — Telephone Encounter (Signed)
Pt saw Chelle on Monday for bloating and back pain.  Chelle stated it could be constipation and gave some recommendation for things to try, which the pt has tried them all.  Pt stated pain has gotten worst around the kidney area on the left side of the back.  Walking around and just sitting she gets sharp shooting pains.    Pt # [phone]

## 2013-12-13 ENCOUNTER — Telehealth: Payer: Self-pay | Admitting: *Deleted

## 2013-12-13 NOTE — Telephone Encounter (Signed)
Message was sent to incorrect pool. Called patient, left message for her to call back, if she is having sharp pains, she should return to clinic.

## 2013-12-13 NOTE — Telephone Encounter (Signed)
Pt is still having sharp pain around her left kidney.Happens off and on. When it happens it's terrible. Worse with laying down. Stomach pain is better, bloating down. Still fatigue. Still has running nose And getting bad headaches. Her worse symptom is around the kidney . Her bowels are moving but not a lot. Please advise if pt needs to return .

## 2013-12-13 NOTE — Telephone Encounter (Signed)
Pt called stating that she is still continuing to have pain. Reports that some days are worse then others. Reports that she was seen and told that she had constipation. Wanted to know what else if anything she could do.

## 2013-12-13 NOTE — Telephone Encounter (Signed)
lmom for pt to cb to obtain details of condition.

## 2013-12-13 NOTE — Telephone Encounter (Signed)
Please get the details of her current symptoms. 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 What is better/worse/unchanged/new? Is she moving her bowels regularly? Are they hard or soft?

## 2013-12-14 NOTE — Telephone Encounter (Signed)
Recommend she RTC

## 2013-12-16 NOTE — Telephone Encounter (Signed)
LM for pt to RTC.  This is a duplicate message.

## 2014-03-08 ENCOUNTER — Encounter (HOSPITAL_COMMUNITY): Payer: Self-pay | Admitting: *Deleted

## 2014-03-08 ENCOUNTER — Inpatient Hospital Stay (HOSPITAL_COMMUNITY)
Admission: AD | Admit: 2014-03-08 | Discharge: 2014-03-08 | Disposition: A | Discharge status: Home / Self Care | Payer: 59 | Source: Ambulatory Visit | Attending: Obstetrics & Gynecology | Admitting: Obstetrics & Gynecology

## 2014-03-08 DIAGNOSIS — Z3A01 Less than 8 weeks gestation of pregnancy: Secondary | ICD-10-CM | POA: Diagnosis not present

## 2014-03-08 DIAGNOSIS — O210 Mild hyperemesis gravidarum: Secondary | ICD-10-CM | POA: Diagnosis present

## 2014-03-08 DIAGNOSIS — O219 Vomiting of pregnancy, unspecified: Secondary | ICD-10-CM

## 2014-03-08 LAB — URINALYSIS, ROUTINE W REFLEX MICROSCOPIC
Bilirubin Urine: NEGATIVE
Glucose, UA: NEGATIVE mg/dL
Hgb urine dipstick: NEGATIVE
Ketones, ur: NEGATIVE mg/dL
Nitrite: NEGATIVE
PROTEIN: NEGATIVE mg/dL
Specific Gravity, Urine: 1.015 (ref 1.005–1.030)
Urobilinogen, UA: 0.2 mg/dL (ref 0.0–1.0)
pH: 7 (ref 5.0–8.0)

## 2014-03-08 LAB — URINE MICROSCOPIC-ADD ON

## 2014-03-08 LAB — POCT PREGNANCY, URINE: Preg Test, Ur: POSITIVE — AB

## 2014-03-08 MED ORDER — PROMETHAZINE HCL 25 MG PO TABS: 12.5000 mg | ORAL_TABLET | Freq: Once | ORAL | Status: DC

## 2014-03-08 MED ORDER — METOCLOPRAMIDE HCL 10 MG PO TABS
10.0000 mg | ORAL_TABLET | Freq: Once | ORAL | Status: AC
  Administered 2014-03-08: 10 mg via ORAL
  Filled 2014-03-08: qty 1

## 2014-03-08 MED ORDER — METOCLOPRAMIDE HCL 5 MG/ML IJ SOLN: 10.0000 mg | Freq: Once | INTRAMUSCULAR | Status: DC

## 2014-03-08 NOTE — MAU Provider Note (Signed)
History     CSN: [id number]  Arrival date and time: 03/08/14 1415   First Provider Initiated Contact with Patient 03/08/14 1450      Chief Complaint  Patient presents with  . Emesis During Pregnancy   HPI [name] is a 23 y.o. G1P0 at 1617w0d who presents to MAU today with complaint of N/V. The patient states that this has been daily x 1 week. She denies diarrhea, constipation, abdominal pain, vaginal bleeding, UTI symptoms or fever. She states occasional headaches recently, but none now. She states small amount of normal appearing white discharge without odor or irritation.   OB History   Grav Para Term Preterm Abortions TAB SAB Ect Mult Living   1               Past Medical History  Diagnosis Date  . Anxiety   . PCOS (polycystic ovarian syndrome)     Past Surgical History  Procedure Laterality Date  . Tonsillitis    . Wisdom tooth extraction    . Tonsillectomy      Family History  Problem Relation Age of Onset  . Crohn's disease Sister     History  Substance Use Topics  . Smoking status: Never Smoker   . Smokeless tobacco: Never Used  . Alcohol Use: 1.5 oz/week    3 drink(s) per week     Comment: not during pregnancy    Allergies:  Allergies  Allergen Reactions  . Pertussis Vaccines Hives  . Penicillins Itching  . Tape Rash    No prescriptions prior to admission    Review of Systems  Constitutional: Negative for fever and malaise/fatigue.  Gastrointestinal: Positive for nausea and vomiting. Negative for abdominal pain, diarrhea and constipation.  Genitourinary: Negative for dysuria, urgency and frequency.       Neg - vaginal bleeding, abnormal discharge   Physical Exam   Blood pressure 111/62, pulse 79, temperature 98 F (36.7 C), resp. rate 18, height 5\' 6"  (1.676 m), weight 162 lb (73.483 kg), last menstrual period 01/18/2014.  Physical Exam  Constitutional: She is oriented to person, place, and time. She appears well-developed and  well-nourished. No distress.  HENT:  Head: Normocephalic.  Cardiovascular: Normal rate.   Respiratory: Effort normal.  GI: Soft. She exhibits no distension and no mass. There is no tenderness. There is no rebound and no guarding.  Neurological: She is alert and oriented to person, place, and time.  Skin: Skin is warm and dry. No erythema.  Psychiatric: She has a normal mood and affect.   Results for orders placed during the hospital encounter of 03/08/14 (from the past 24 hour(s))  URINALYSIS, ROUTINE W REFLEX MICROSCOPIC     Status: Abnormal   Collection Time    03/08/14  2:20 PM      Result Value Ref Range   Color, Urine YELLOW  YELLOW   APPearance CLEAR  CLEAR   Specific Gravity, Urine 1.015  1.005 - 1.030   pH 7.0  5.0 - 8.0   Glucose, UA NEGATIVE  NEGATIVE mg/dL   Hgb urine dipstick NEGATIVE  NEGATIVE   Bilirubin Urine NEGATIVE  NEGATIVE   Ketones, ur NEGATIVE  NEGATIVE mg/dL   Protein, ur NEGATIVE  NEGATIVE mg/dL   Urobilinogen, UA 0.2  0.0 - 1.0 mg/dL   Nitrite NEGATIVE  NEGATIVE   Leukocytes, UA SMALL (*) NEGATIVE  URINE MICROSCOPIC-ADD ON     Status: Abnormal   Collection Time    03/08/14  2:20 PM      Result Value Ref Range   Squamous Epithelial / LPF FEW (*) RARE   WBC, UA 0-2  <3 WBC/hpf   Bacteria, UA FEW (*) RARE  POCT PREGNANCY, URINE     Status: Abnormal   Collection Time    03/08/14  2:46 PM      Result Value Ref Range   Preg Test, Ur POSITIVE (*) NEGATIVE    MAU Course  Procedures None  MDM +UPT UA today Discussed with Dr. Langston MaskerMorris. PO anti-emetics and PO challenge. Patient is driving today. 10 mg PO Reglan given in MAU.  Patient reports significant improvement in N/V. No episodes of emesis in MAU. Able to tolerate PO.   Assessment and Plan  A: Nausea and vomiting in pregnancy prior to [redacted] weeks gestation  P: Discharge home Patient advised to pick up and start Rx already sent from [facility] Patient advised to follow-up with  [facility] as scheduled for routine prenatal care Patient may return to MAU as needed or if her condition were to change or worsen  Marny LowensteinJulie N Wenzel, PA-C  03/08/2014, 5:05 PM

## 2014-03-08 NOTE — Discharge Instructions (Signed)
Morning Sickness °Morning sickness is when you feel sick to your stomach (nauseous) during pregnancy. This nauseous feeling may or may not come with vomiting. It often occurs in the morning but can be a problem any time of day. Morning sickness is most common during the first trimester, but it may continue throughout pregnancy. While morning sickness is unpleasant, it is usually harmless unless you develop severe and continual vomiting (hyperemesis gravidarum). This condition requires more intense treatment.  °CAUSES  °The cause of morning sickness is not completely known but seems to be related to normal hormonal changes that occur in pregnancy. °RISK FACTORS °You are at greater risk if you: °· Experienced nausea or vomiting before your pregnancy. °· Had morning sickness during a previous pregnancy. °· Are pregnant with more than one baby, such as twins. °TREATMENT  °Do not use any medicines (prescription, over-the-counter, or herbal) for morning sickness without first talking to your health care provider. Your health care provider may prescribe or recommend: °· Vitamin B6 supplements. °· Anti-nausea medicines. °· The herbal medicine ginger. °HOME CARE INSTRUCTIONS  °· Only take over-the-counter or prescription medicines as directed by your health care provider. °· Taking multivitamins before getting pregnant can prevent or decrease the severity of morning sickness in most women. °· Eat a piece of dry toast or unsalted crackers before getting out of bed in the morning. °· Eat five or six small meals a day. °· Eat dry and bland foods (rice, baked potato). Foods high in carbohydrates are often helpful. °· Do not drink liquids with your meals. Drink liquids between meals. °· Avoid greasy, fatty, and spicy foods. °· Get someone to cook for you if the smell of any food causes nausea and vomiting. °· If you feel nauseous after taking prenatal vitamins, take the vitamins at night or with a snack.  °· Snack on protein  foods (nuts, yogurt, cheese) between meals if you are hungry. °· Eat unsweetened gelatins for desserts. °· Wearing an acupressure wristband (worn for sea sickness) may be helpful. °· Acupuncture may be helpful. °· Do not smoke. °· Get a humidifier to keep the air in your house free of odors. °· Get plenty of fresh air. °SEEK MEDICAL CARE IF:  °· Your home remedies are not working, and you need medicine. °· You feel dizzy or lightheaded. °· You are losing weight. °SEEK IMMEDIATE MEDICAL CARE IF:  °· You have persistent and uncontrolled nausea and vomiting. °· You pass out (faint). °MAKE SURE YOU: °· Understand these instructions. °· Will watch your condition. °· Will get help right away if you are not doing well or get worse. °Document Released: 06/27/2006 Document Revised: 05/11/2013 Document Reviewed: 10/21/2012 °ExitCare® Patient Information ©2015 [employer]. This information is not intended to replace advice given to you by your health care provider. Make sure you discuss any questions you have with your health care provider. ° ° °Eating Plan for Hyperemesis Gravidarum °Severe cases of hyperemesis gravidarum can lead to dehydration and malnutrition. The hyperemesis eating plan is one way to lessen the symptoms of nausea and vomiting. It is often used with prescribed medicines to control your symptoms.  °WHAT CAN I DO TO RELIEVE MY SYMPTOMS? °Listen to your body. Everyone is different and has different preferences. Find what works best for you. Some of the following things may help: °· Eat and drink slowly. °· Eat 5-6 small meals daily instead of 3 large meals.   °· Eat crackers before you get out of bed in the   morning.   °· Starchy foods are usually well tolerated (such as cereal, toast, bread, potatoes, pasta, rice, and pretzels).   °· Ginger may help with nausea. Add ¼ tsp ground ginger to hot tea or choose ginger tea.   °· Try drinking 100% fruit juice or an electrolyte drink. °· Continue to take your  prenatal vitamins as directed by your health care provider. If you are having trouble taking your prenatal vitamins, talk with your health care provider about different options. °· Include at least 1 serving of protein with your meals and snacks (such as meats or poultry, beans, nuts, eggs, or yogurt). Try eating a protein-rich snack before bed (such as cheese and crackers or a half turkey or peanut butter sandwich). °WHAT THINGS SHOULD I AVOID TO REDUCE MY SYMPTOMS? °The following things may help reduce your symptoms: °· Avoid foods with strong smells. Try eating meals in well-ventilated areas that are free of odors. °· Avoid drinking water or other beverages with meals. Try not to drink anything less than 30 minutes before and after meals. °· Avoid drinking more than 1 cup of fluid at a time. °· Avoid fried or high-fat foods, such as butter and cream sauces. °· Avoid spicy foods. °· Avoid skipping meals the best you can. Nausea can be more intense on an empty stomach. If you cannot tolerate food at that time, do not force it. Try sucking on ice chips or other frozen items and make up the calories later. °· Avoid lying down within 2 hours after eating. °Document Released: 03/03/2007 Document Revised: 05/11/2013 Document Reviewed: 03/10/2013 °ExitCare® Patient Information ©2015 [employer]. This information is not intended to replace advice given to you by your health care provider. Make sure you discuss any questions you have with your health care provider. ° ° °

## 2014-03-08 NOTE — Progress Notes (Signed)
Harlon FlorJ. Wenzel PA in to see pt to discuss d/c plan. Written and verbal d/c instructions given and understanding voiced.

## 2014-03-08 NOTE — Progress Notes (Signed)
Anxiety but no depression

## 2014-03-08 NOTE — MAU Note (Signed)
N/V for a week. Does not have med at home for nausea. No pain

## 2014-03-08 NOTE — Progress Notes (Signed)
Pt drank apple juice before taking Reglan and tol well so far. More apple juice to pt

## 2014-03-20 ENCOUNTER — Inpatient Hospital Stay (HOSPITAL_COMMUNITY)
Admission: AD | Admit: 2014-03-20 | Discharge: 2014-03-21 | Disposition: A | Discharge status: Home / Self Care | Payer: 59 | Source: Ambulatory Visit | Attending: Obstetrics and Gynecology | Admitting: Obstetrics and Gynecology

## 2014-03-20 ENCOUNTER — Encounter (HOSPITAL_COMMUNITY): Payer: Self-pay | Admitting: *Deleted

## 2014-03-20 DIAGNOSIS — R110 Nausea: Secondary | ICD-10-CM | POA: Insufficient documentation

## 2014-03-20 DIAGNOSIS — Z3A09 9 weeks gestation of pregnancy: Secondary | ICD-10-CM | POA: Diagnosis not present

## 2014-03-20 DIAGNOSIS — O219 Vomiting of pregnancy, unspecified: Secondary | ICD-10-CM

## 2014-03-20 LAB — URINALYSIS, ROUTINE W REFLEX MICROSCOPIC
Bilirubin Urine: NEGATIVE
Glucose, UA: NEGATIVE mg/dL
HGB URINE DIPSTICK: NEGATIVE
Ketones, ur: 15 mg/dL — AB
Nitrite: NEGATIVE
PH: 7.5 (ref 5.0–8.0)
Protein, ur: NEGATIVE mg/dL
Specific Gravity, Urine: 1.015 (ref 1.005–1.030)
Urobilinogen, UA: 0.2 mg/dL (ref 0.0–1.0)

## 2014-03-20 LAB — URINE MICROSCOPIC-ADD ON

## 2014-03-20 MED ORDER — PROMETHAZINE HCL 25 MG/ML IJ SOLN
25.0000 mg | Freq: Once | INTRAMUSCULAR | Status: AC
  Administered 2014-03-20: 25 mg via INTRAMUSCULAR
  Filled 2014-03-20: qty 1

## 2014-03-20 MED ORDER — PROMETHAZINE HCL 25 MG PO TABS: 25.0000 mg | ORAL_TABLET | Freq: Once | ORAL | Status: DC

## 2014-03-20 NOTE — MAU Provider Note (Signed)
History     CSN: [id number]  Arrival date and time: 03/20/14 2110   First Provider Initiated Contact with Patient 03/20/14 2153      No chief complaint on file.  HPI  Sheryl Wright is a 23 y.o. G1P0 at 1043w5d who presents today with nausea and vomiting. She states that she has been using dicligis, and that has helped a lot. However, she has run out of her meds, and the prescription did not make it to the pharmacy. She has been vomiting "all day" today. She denies any abdominal pain or vaginal bleeding. She states that she has had three ultrasounds in the office, and that every thing has been "ok".   Past Medical History  Diagnosis Date  . Anxiety   . PCOS (polycystic ovarian syndrome)     Past Surgical History  Procedure Laterality Date  . Tonsillitis    . Wisdom tooth extraction    . Tonsillectomy      Family History  Problem Relation Age of Onset  . Crohn's disease Sister     History  Substance Use Topics  . Smoking status: Never Smoker   . Smokeless tobacco: Never Used  . Alcohol Use: 1.5 oz/week    3 drink(s) per week     Comment: not during pregnancy    Allergies:  Allergies  Allergen Reactions  . Pertussis Vaccines Hives  . Penicillins Itching  . Tape Rash    Prescriptions prior to admission  Medication Sig Dispense Refill Last Dose  . acetaminophen (TYLENOL) 325 MG tablet Take 650 mg by mouth every 6 (six) hours as needed for headache.    Past Week at Unknown time  . Doxylamine-Pyridoxine (DICLEGIS) 10-10 MG TBEC Take 1 tablet by mouth 3 (three) times daily as needed (nausea/vomiting).   03/18/2014 at Unknown time  . OVER THE COUNTER MEDICATION Take 15 mLs by mouth daily as needed (nausea/vomiting). Pt reports using OTC oral solution to treat N/V; unknown drug.   03/19/2014 at Unknown time  . Prenatal Vit-Fe Fumarate-FA (PRENATAL MULTIVITAMIN) TABS tablet Take 1 tablet by mouth daily at 12 noon.   03/18/2014 at Unknown time  . Progesterone 50 MG SUPP Place  50 mg vaginally 2 (two) times daily.   03/18/2014 at Unknown time    ROS Physical Exam   Blood pressure 112/71, pulse 89, temperature 98.4 F (36.9 C), temperature source Oral, resp. rate 16, height 5\' 6"  (1.676 m), weight 73.029 kg (161 lb), last menstrual period 01/18/2014, SpO2 98 %.  Physical Exam  Nursing note and vitals reviewed. Constitutional: She is oriented to person, place, and time. She appears well-developed and well-nourished. No distress.  Cardiovascular: Normal rate.   Respiratory: Effort normal.  GI: Soft. There is no tenderness. There is no rebound.  Neurological: She is alert and oriented to person, place, and time.  Skin: Skin is warm and dry.  Psychiatric: She has a normal mood and affect.    MAU Course  Procedures  Results for orders placed or performed during the hospital encounter of 03/20/14 (from the past 24 hour(s))  Urinalysis, Routine w reflex microscopic     Status: Abnormal   Collection Time: 03/20/14  9:18 PM  Result Value Ref Range   Color, Urine YELLOW YELLOW   APPearance CLEAR CLEAR   Specific Gravity, Urine 1.015 1.005 - 1.030   pH 7.5 5.0 - 8.0   Glucose, UA NEGATIVE NEGATIVE mg/dL   Hgb urine dipstick NEGATIVE NEGATIVE   Bilirubin Urine NEGATIVE  NEGATIVE   Ketones, ur 15 (A) NEGATIVE mg/dL   Protein, ur NEGATIVE NEGATIVE mg/dL   Urobilinogen, UA 0.2 0.0 - 1.0 mg/dL   Nitrite NEGATIVE NEGATIVE   Leukocytes, UA SMALL (A) NEGATIVE  Urine microscopic-add on     Status: Abnormal   Collection Time: 03/20/14  9:18 PM  Result Value Ref Range   Squamous Epithelial / LPF FEW (A) RARE   WBC, UA 3-6 <3 WBC/hpf   RBC / HPF 0-2 <3 RBC/hpf   Bacteria, UA FEW (A) RARE   Urine-Other AMORPHOUS URATES/PHOSPHATES    [id number]: Patient has had 25mg  phenergan IM. She has been able to keep down food and fluids, and has not vomited in over an hour.   Assessment and Plan   1. Nausea/vomiting in pregnancy    Comfort measures reviewed Return to MAU as  needed     Medication List    TAKE these medications        acetaminophen 325 MG tablet  Commonly known as:  TYLENOL  Take 650 mg by mouth every 6 (six) hours as needed for headache.     DICLEGIS 10-10 MG Tbec  Generic drug:  Doxylamine-Pyridoxine  Take 1 tablet by mouth 3 (three) times daily as needed (nausea/vomiting).     Doxylamine-Pyridoxine 10-10 MG Tbec  Commonly known as:  DICLEGIS  Take 2 tablets by mouth at bedtime. If sx persist after 2 days increase to 1 tab PO qam and 2 tabs PO qhs. If sx persist then may increase further to 1 tab PO qam, 1 tab PO mid-day and 2 tabs PO qhs. Max 4 tabs per day.     OVER THE COUNTER MEDICATION  Take 15 mLs by mouth daily as needed (nausea/vomiting). Pt reports using OTC oral solution to treat N/V; unknown drug.     prenatal multivitamin Tabs tablet  Take 1 tablet by mouth daily at 12 noon.     Progesterone 50 MG Supp  Place 50 mg vaginally 2 (two) times daily.     promethazine 25 MG tablet  Commonly known as:  PHENERGAN  Take 0.5-1 tablets (12.5-25 mg total) by mouth every 6 (six) hours as needed.       Follow-up Information    [facility] E, [facility].   Specialty:  Obstetrics and Gynecology   Why:  As scheduled   Contact information:   [address] [place] [address] [phone]        Sheryl Wright, Sheryl Wright 03/20/2014, 9:57 PM

## 2014-03-20 NOTE — MAU Note (Signed)
Pt reports she was here 2 weeks ago and given meds for hyperemesis, ran out of meds on Friday and pharmacy did not have her RX

## 2014-03-21 ENCOUNTER — Encounter (HOSPITAL_COMMUNITY): Payer: Self-pay | Admitting: *Deleted

## 2014-03-21 DIAGNOSIS — O219 Vomiting of pregnancy, unspecified: Secondary | ICD-10-CM

## 2014-03-21 MED ORDER — PROMETHAZINE HCL 25 MG PO TABS: 12.5000 mg | ORAL_TABLET | Freq: Four times a day (QID) | ORAL | Status: DC | PRN

## 2014-03-21 MED ORDER — DOXYLAMINE-PYRIDOXINE 10-10 MG PO TBEC: 2.0000 | DELAYED_RELEASE_TABLET | Freq: Every day | ORAL | Status: DC

## 2014-03-21 NOTE — Discharge Instructions (Signed)

## 2014-03-24 LAB — OB RESULTS CONSOLE GC/CHLAMYDIA
CHLAMYDIA, DNA PROBE: NEGATIVE
GC PROBE AMP, GENITAL: NEGATIVE

## 2014-03-24 LAB — OB RESULTS CONSOLE RUBELLA ANTIBODY, IGM: RUBELLA: IMMUNE

## 2014-03-24 LAB — OB RESULTS CONSOLE ANTIBODY SCREEN: ANTIBODY SCREEN: NEGATIVE

## 2014-03-24 LAB — OB RESULTS CONSOLE ABO/RH: RH Type: POSITIVE

## 2014-03-24 LAB — OB RESULTS CONSOLE RPR: RPR: NONREACTIVE

## 2014-03-24 LAB — OB RESULTS CONSOLE HIV ANTIBODY (ROUTINE TESTING): HIV: NONREACTIVE

## 2014-03-24 LAB — OB RESULTS CONSOLE HEPATITIS B SURFACE ANTIGEN: Hepatitis B Surface Ag: NEGATIVE

## 2014-06-21 ENCOUNTER — Other Ambulatory Visit: Payer: Self-pay | Admitting: Obstetrics and Gynecology

## 2014-06-21 ENCOUNTER — Ambulatory Visit (HOSPITAL_COMMUNITY): Payer: 59

## 2014-06-21 ENCOUNTER — Ambulatory Visit (HOSPITAL_COMMUNITY)
Admission: RE | Admit: 2014-06-21 | Discharge: 2014-06-21 | Disposition: A | Discharge status: Home / Self Care | Payer: 59 | Source: Ambulatory Visit | Attending: Obstetrics and Gynecology | Admitting: Obstetrics and Gynecology

## 2014-06-21 DIAGNOSIS — N23 Unspecified renal colic: Secondary | ICD-10-CM | POA: Insufficient documentation

## 2014-06-22 ENCOUNTER — Ambulatory Visit (HOSPITAL_COMMUNITY): Payer: 59

## 2014-08-08 ENCOUNTER — Other Ambulatory Visit (HOSPITAL_COMMUNITY): Payer: Self-pay | Admitting: Obstetrics and Gynecology

## 2014-08-08 DIAGNOSIS — R110 Nausea: Secondary | ICD-10-CM

## 2014-08-08 DIAGNOSIS — R1013 Epigastric pain: Secondary | ICD-10-CM

## 2014-08-09 ENCOUNTER — Ambulatory Visit (HOSPITAL_COMMUNITY)
Admission: RE | Admit: 2014-08-09 | Discharge: 2014-08-09 | Disposition: A | Discharge status: Home / Self Care | Payer: 59 | Source: Ambulatory Visit | Attending: Obstetrics and Gynecology | Admitting: Obstetrics and Gynecology

## 2014-08-09 ENCOUNTER — Other Ambulatory Visit (HOSPITAL_COMMUNITY): Payer: Self-pay | Admitting: Obstetrics and Gynecology

## 2014-08-09 DIAGNOSIS — O26892 Other specified pregnancy related conditions, second trimester: Secondary | ICD-10-CM | POA: Diagnosis present

## 2014-08-09 DIAGNOSIS — R110 Nausea: Secondary | ICD-10-CM | POA: Insufficient documentation

## 2014-08-09 DIAGNOSIS — R1013 Epigastric pain: Secondary | ICD-10-CM

## 2014-08-21 IMAGING — CR DG ABDOMEN ACUTE W/ 1V CHEST
3 series · 3 of 3 positions shown · non-contrast
Comparison: None.

CLINICAL DATA: Abdominal bloating and constipation.

EXAM:
ACUTE ABDOMEN SERIES (ABDOMEN 2 VIEW & CHEST 1 VIEW)

[PA]
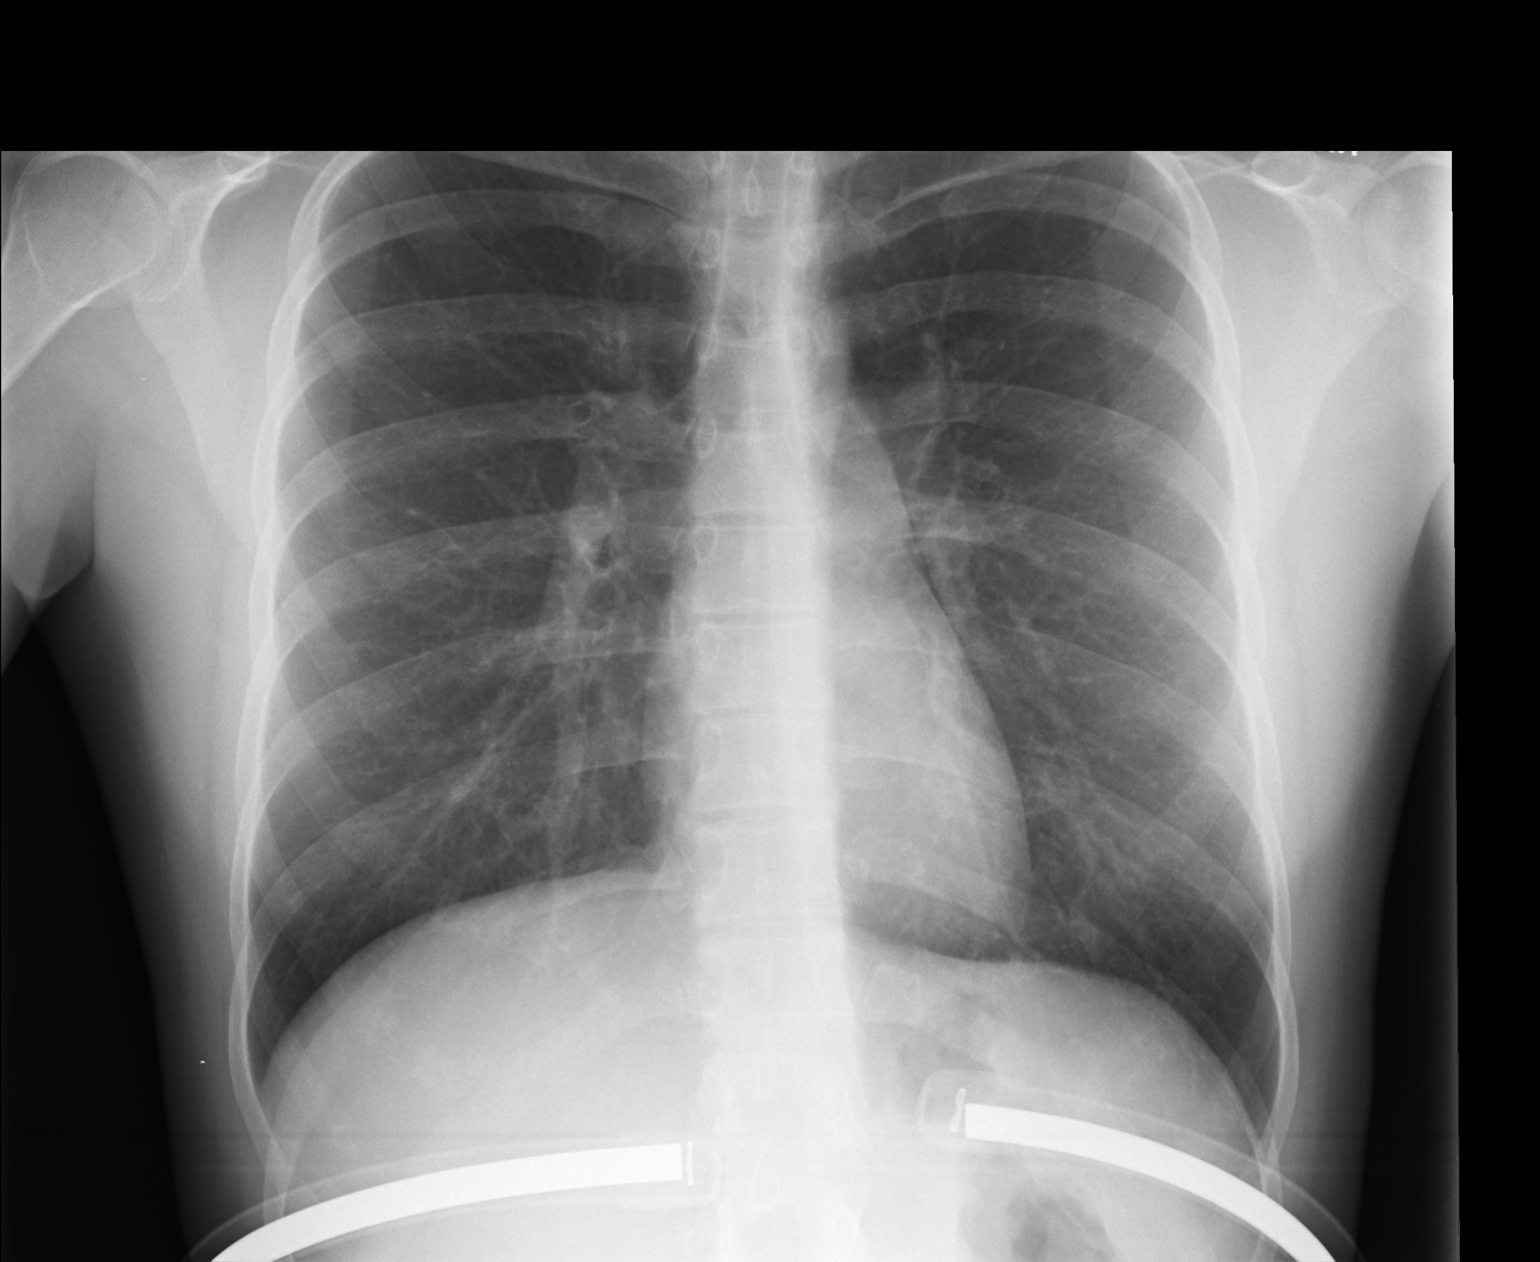

[AP (1 of 2)]
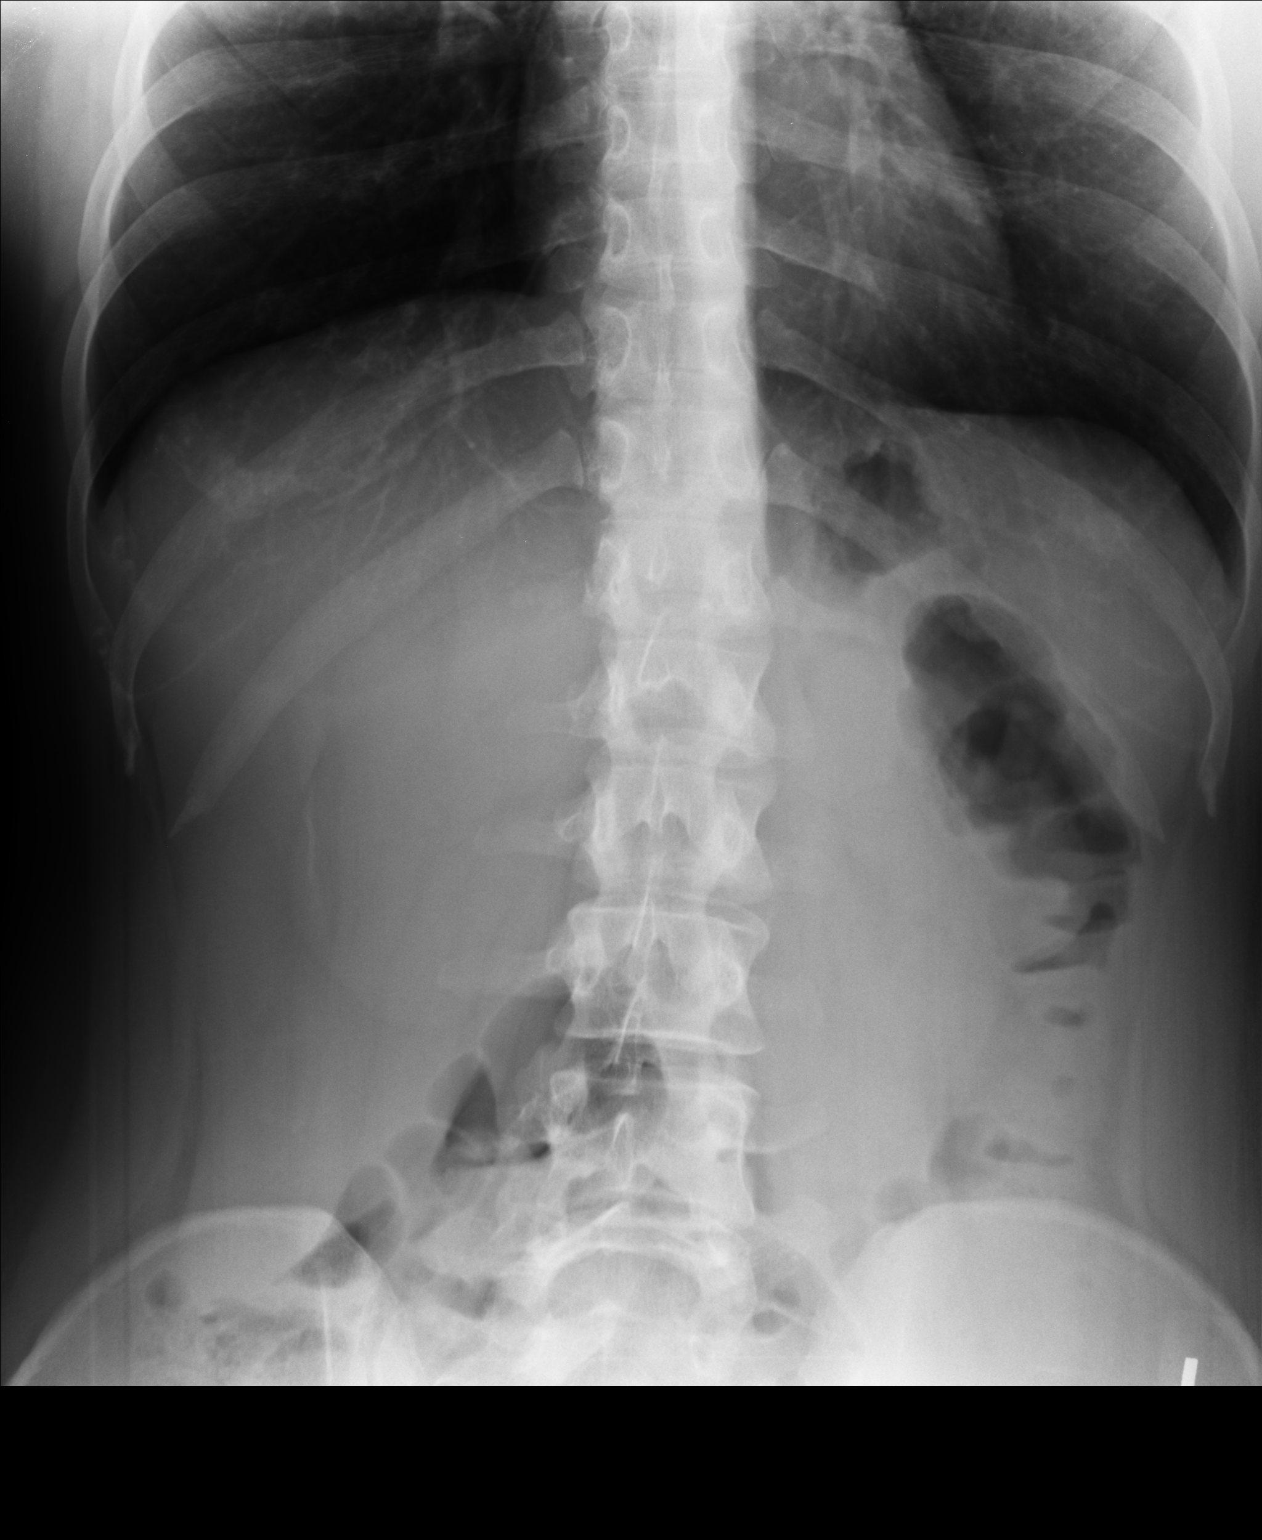

[AP (2 of 2)]
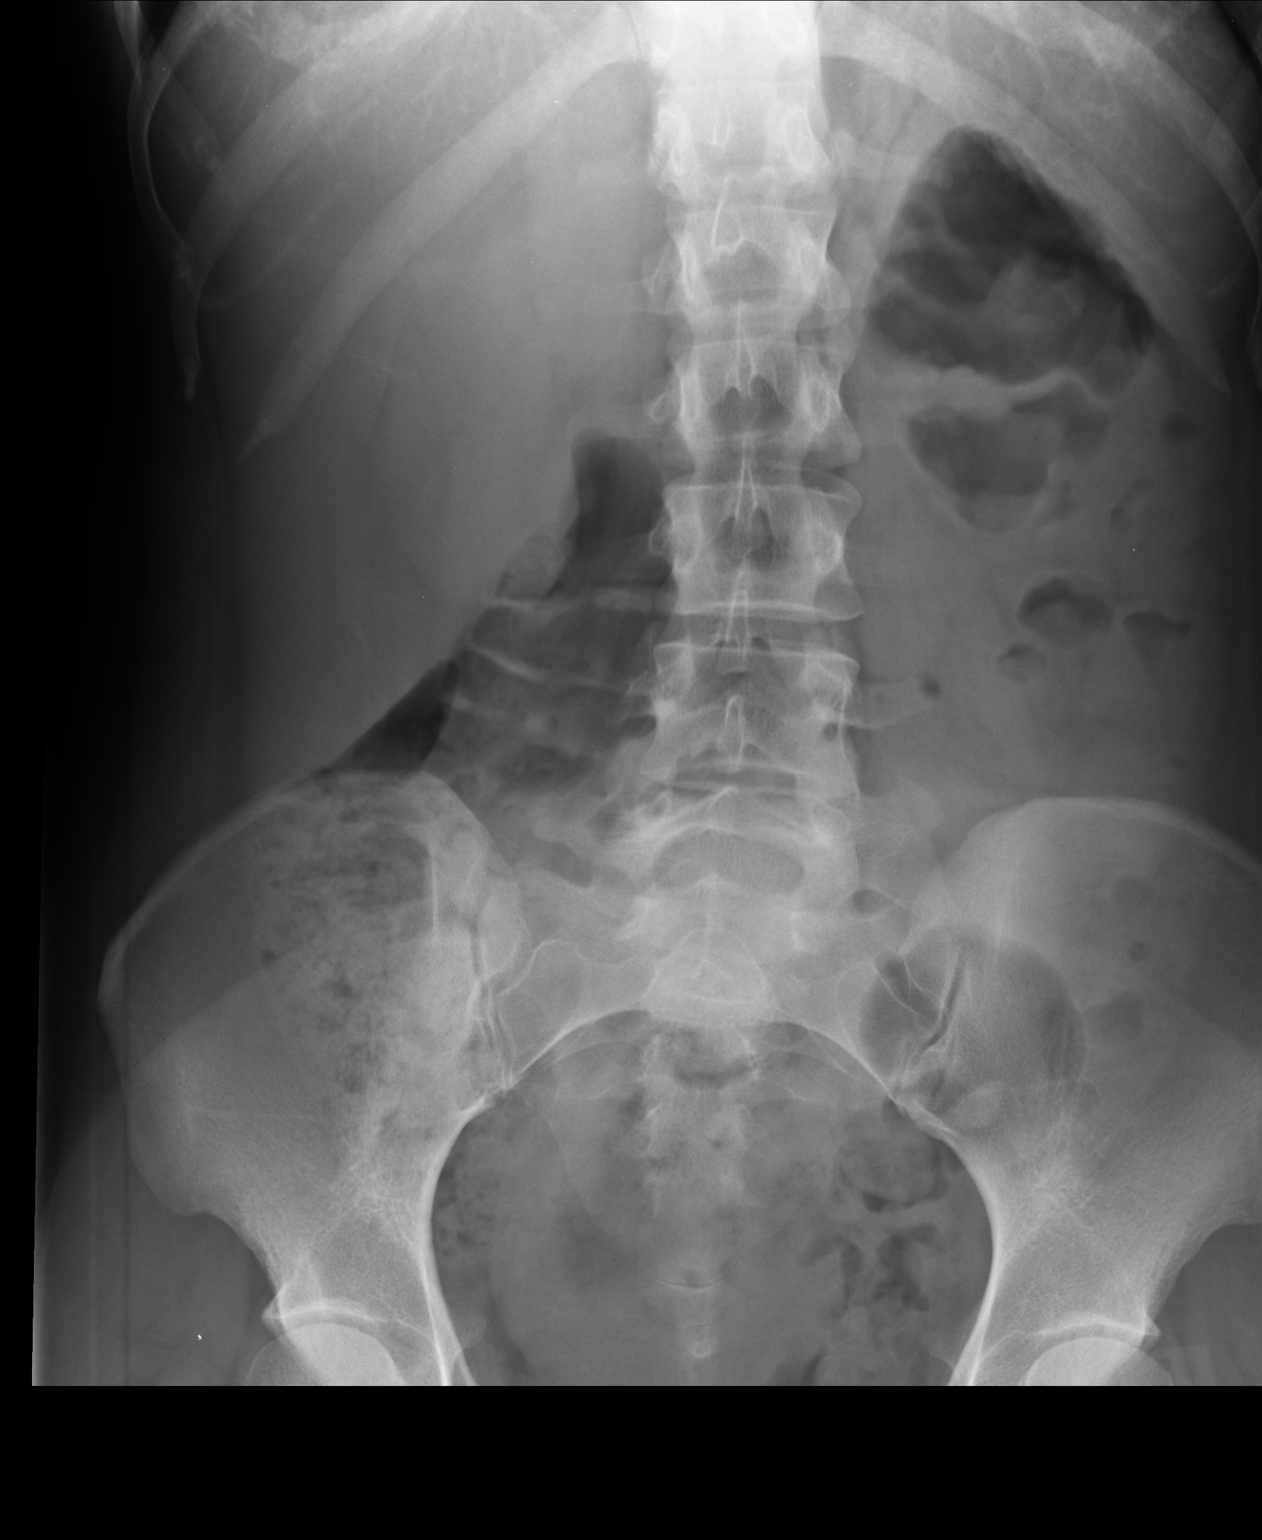

[3 of 3 positions shown; findings below may reference images not displayed]

FINDINGS: Single view of the chest demonstrates clear lungs and normal heart
size. No pneumothorax or pleural effusion.

Two views of the abdomen show no free intraperitoneal air. The bowel
gas pattern is normal. No abnormal abdominal calcification is seen.
IMPRESSION: Negative exam.

## 2014-08-22 ENCOUNTER — Inpatient Hospital Stay (HOSPITAL_COMMUNITY)
Admission: AD | Admit: 2014-08-22 | Discharge: 2014-08-22 | Disposition: A | Discharge status: Home / Self Care | Payer: 59 | Source: Ambulatory Visit | Attending: Obstetrics & Gynecology | Admitting: Obstetrics & Gynecology

## 2014-08-22 ENCOUNTER — Encounter (HOSPITAL_COMMUNITY): Payer: Self-pay | Admitting: *Deleted

## 2014-08-22 DIAGNOSIS — Z3A30 30 weeks gestation of pregnancy: Secondary | ICD-10-CM | POA: Insufficient documentation

## 2014-08-22 DIAGNOSIS — O479 False labor, unspecified: Secondary | ICD-10-CM

## 2014-08-22 DIAGNOSIS — Z3689 Encounter for other specified antenatal screening: Secondary | ICD-10-CM

## 2014-08-22 DIAGNOSIS — O368130 Decreased fetal movements, third trimester, not applicable or unspecified: Secondary | ICD-10-CM | POA: Diagnosis not present

## 2014-08-22 LAB — URINALYSIS, ROUTINE W REFLEX MICROSCOPIC
Bilirubin Urine: NEGATIVE
Glucose, UA: NEGATIVE mg/dL
Ketones, ur: NEGATIVE mg/dL
Nitrite: NEGATIVE
Protein, ur: NEGATIVE mg/dL
Specific Gravity, Urine: 1.025 (ref 1.005–1.030)
Urobilinogen, UA: 0.2 mg/dL (ref 0.0–1.0)
pH: 6 (ref 5.0–8.0)

## 2014-08-22 LAB — URINE MICROSCOPIC-ADD ON

## 2014-08-22 NOTE — MAU Provider Note (Signed)
History     CSN: [id number]  Arrival date and time: 08/22/14 1633   First Provider Initiated Contact with Patient 08/22/14 1739      Chief Complaint  Patient presents with  . Decreased Fetal Movement   HPI Sheryl Wright is 24 y.o. G1P0 [id number] weeks presenting for evaluation after being seen at [facility] today.  They saw a nuchal cord and she is concerned.  Call[place] the office and spoke to [place], when asked about fetal movement, patient reported decreased movement.  Sent here for evaluation.  She reports a few contractions today.  Denies vaginal bleeding or leaking of fluid.  She also is concerned because her delivery was emergency C-Section because her" nuchal cord was draped over shoulder".   She is a patient of Dr. Dennie BibleHolland's, last seen in office 2 weeks ago.  Has apptointment 08/24/14.      Past Medical History  Diagnosis Date  . Anxiety   . PCOS (polycystic ovarian syndrome)     Past Surgical History  Procedure Laterality Date  . Tonsillitis    . Wisdom tooth extraction    . Tonsillectomy      Family History  Problem Relation Age of Onset  . Crohn's disease Sister     History  Substance Use Topics  . Smoking status: Never Smoker   . Smokeless tobacco: Never Used  . Alcohol Use: 1.5 oz/week    3 drink(s) per week     Comment: not during pregnancy    Allergies:  Allergies  Allergen Reactions  . Pertussis Vaccines Hives  . Penicillins Itching  . Tape Rash    Prescriptions prior to admission  Medication Sig Dispense Refill Last Dose  . acetaminophen (TYLENOL) 325 MG tablet Take 650 mg by mouth every 6 (six) hours as needed for headache.    Past Week at Unknown time  . FLUoxetine (PROZAC) 20 MG tablet Take 20 mg by mouth every other day. Alternates between 40mg  and 20mg  to average 30mg    08/22/2014 at Unknown time  . FLUoxetine (PROZAC) 40 MG capsule Take 40 mg by mouth every other day.   08/21/2014 at Unknown time  . Prenatal Vit-Fe Fumarate-FA (PRENATAL  MULTIVITAMIN) TABS tablet Take 1 tablet by mouth daily at 12 noon.   08/21/2014 at Unknown time  . tetrahydrozoline-zinc (VISINE-AC) 0.05-0.25 % ophthalmic solution Place 2 drops into the left eye 3 (three) times daily as needed (allergies).   08/21/2014 at Unknown time  . Doxylamine-Pyridoxine (DICLEGIS) 10-10 MG TBEC Take 1 tablet by mouth 3 (three) times daily as needed (nausea/vomiting).   More than a month at Unknown time  . Doxylamine-Pyridoxine (DICLEGIS) 10-10 MG TBEC Take 2 tablets by mouth at bedtime. If sx persist after 2 days increase to 1 tab PO qam and 2 tabs PO qhs. If sx persist then may increase further to 1 tab PO qam, 1 tab PO mid-day and 2 tabs PO qhs. Max 4 tabs per day. (Patient not taking: Reported on 08/22/2014) 90 tablet 1 More than a month at Unknown time  . OVER THE COUNTER MEDICATION Take 15 mLs by mouth daily as needed (nausea/vomiting). Pt reports using OTC oral solution to treat N/V; unknown drug.   More than a month at Unknown time  . promethazine (PHENERGAN) 25 MG tablet Take 0.5-1 tablets (12.5-25 mg total) by mouth every 6 (six) hours as needed. (Patient not taking: Reported on 08/22/2014) 30 tablet 0 More than a month at Unknown time  Review of Systems  Constitutional: Negative for fever and chills.  Gastrointestinal: Positive for abdominal pain ( occasional contraction.  Decreased fetal movement). Negative for nausea and vomiting.       Decreased fetal movement  Genitourinary: Negative for dysuria, urgency and frequency.       Negative for vaginal bleeding  Neurological: Negative for headaches.   Physical Exam   Blood pressure 124/64, pulse 72, temperature 98.3 F (36.8 C), temperature source Oral, resp. rate 18, last menstrual period 01/18/2014.  Physical Exam  Constitutional: She is oriented to person, place, and time. She appears well-developed and well-nourished.  HENT:  Head: Normocephalic.  Neck: Normal range of motion.  Cardiovascular: Normal rate.    Respiratory: Effort normal.  Genitourinary:  Pelvic deferred per Dr. Lilyan Punt order.  Neurological: She is alert and oriented to person, place, and time.  Psychiatric: She has a normal mood and affect. Her behavior is normal.     Results for orders placed or performed during the hospital encounter of 08/22/14 (from the past 24 hour(s))  Urinalysis, Routine w reflex microscopic     Status: Abnormal   Collection Time: 08/22/14  5:40 PM  Result Value Ref Range   Color, Urine YELLOW YELLOW   APPearance CLEAR CLEAR   Specific Gravity, Urine 1.025 1.005 - 1.030   pH 6.0 5.0 - 8.0   Glucose, UA NEGATIVE NEGATIVE mg/dL   Hgb urine dipstick SMALL (A) NEGATIVE   Bilirubin Urine NEGATIVE NEGATIVE   Ketones, ur NEGATIVE NEGATIVE mg/dL   Protein, ur NEGATIVE NEGATIVE mg/dL   Urobilinogen, UA 0.2 0.0 - 1.0 mg/dL   Nitrite NEGATIVE NEGATIVE   Leukocytes, UA MODERATE (A) NEGATIVE  Urine microscopic-add on     Status: Abnormal   Collection Time: 08/22/14  5:40 PM  Result Value Ref Range   Squamous Epithelial / LPF FEW (A) RARE   WBC, UA 11-20 <3 WBC/hpf   RBC / HPF 7-10 <3 RBC/hpf   Bacteria, UA MANY (A) RARE   Urine-Other MUCOUS PRESENT    MAU Course  Procedures   NST Reactive.  Baseline FHR 140;  Irregular contraction with irritability.                             Urine culture to lab for many bacteria and 11-20WBCs on micro--She is asymptomatic  MDM 17:48  Reported MSE to Dr. Langston Masker.  Discussed patient's concern, NST, waiting for UA results.  Tracing looks good, she is having irregular contractions with irritability.  Patient prefers PO hydration rather than IV hydration.  Order per Dr. Langston Masker, may po hydrate, cervical exam and FFN do not need to be done at this time.   Follow up scheduled appointment for Wednesday.  Call for any worsening sxs of decreased movement, bleeding or loss of fluid.   Assessment and Plan  A:  Decreased fetal movement at [redacted]w[redacted]d gestation       Reactive  NST  P: Call MD for vaginal bleeding, loss of fluid, or decreased movement     Keep scheduled appt for Wednesday     PO hydrate,stress importance of drinking 6-8 glasses of water a day.      Urine culture pending  Codi Folkerts,EVE M 08/22/2014, 5:43 PM

## 2014-08-22 NOTE — Discharge Instructions (Signed)
Third Trimester of Pregnancy The third trimester is from week 29 through week 42, months 7 through 9. The third trimester is a time when the fetus is growing rapidly. At the end of the ninth month, the fetus is about 20 inches in length and weighs 6-10 pounds.  BODY CHANGES Your body goes through many changes during pregnancy. The changes vary from woman to woman.   Your weight will continue to increase. You can expect to gain 25-35 pounds (11-16 kg) by the end of the pregnancy.  You may begin to get stretch marks on your hips, abdomen, and breasts.  You may urinate more often because the fetus is moving lower into your pelvis and pressing on your bladder.  You may develop or continue to have heartburn as a result of your pregnancy.  You may develop constipation because certain hormones are causing the muscles that push waste through your intestines to slow down.  You may develop hemorrhoids or swollen, bulging veins (varicose veins).  You may have pelvic pain because of the weight gain and pregnancy hormones relaxing your joints between the bones in your pelvis. Backaches may result from overexertion of the muscles supporting your posture.  You may have changes in your hair. These can include thickening of your hair, rapid growth, and changes in texture. Some women also have hair loss during or after pregnancy, or hair that feels dry or thin. Your hair will most likely return to normal after your baby is born.  Your breasts will continue to grow and be tender. A yellow discharge may leak from your breasts called colostrum.  Your belly button may stick out.  You may feel short of breath because of your expanding uterus.  You may notice the fetus "dropping," or moving lower in your abdomen.  You may have a bloody mucus discharge. This usually occurs a few days to a week before labor begins.  Your cervix becomes thin and soft (effaced) near your due date. WHAT TO EXPECT AT YOUR PRENATAL  EXAMS  You will have prenatal exams every 2 weeks until week 36. Then, you will have weekly prenatal exams. During a routine prenatal visit:  You will be weighed to make sure you and the fetus are growing normally.  Your blood pressure is taken.  Your abdomen will be measured to track your baby's growth.  The fetal heartbeat will be listened to.  Any test results from the previous visit will be discussed.  You may have a cervical check near your due date to see if you have effaced. At around 36 weeks, your caregiver will check your cervix. At the same time, your caregiver will also perform a test on the secretions of the vaginal tissue. This test is to determine if a type of bacteria, Group B streptococcus, is present. Your caregiver will explain this further. Your caregiver may ask you:  What your birth plan is.  How you are feeling.  If you are feeling the baby move.  If you have had any abnormal symptoms, such as leaking fluid, bleeding, severe headaches, or abdominal cramping.  If you have any questions. Other tests or screenings that may be performed during your third trimester include:  Blood tests that check for low iron levels (anemia).  Fetal testing to check the health, activity level, and growth of the fetus. Testing is done if you have certain medical conditions or if there are problems during the pregnancy. FALSE LABOR You may feel small, irregular contractions that   eventually go away. These are called Braxton Hicks contractions, or false labor. Contractions may last for hours, days, or even weeks before true labor sets in. If contractions come at regular intervals, intensify, or become painful, it is best to be seen by your caregiver.  SIGNS OF LABOR   Menstrual-like cramps.  Contractions that are 5 minutes apart or less.  Contractions that start on the top of the uterus and spread down to the lower abdomen and back.  A sense of increased pelvic pressure or back  pain.  A watery or bloody mucus discharge that comes from the vagina. If you have any of these signs before the 37th week of pregnancy, call your caregiver right away. You need to go to the hospital to get checked immediately. HOME CARE INSTRUCTIONS   Avoid all smoking, herbs, alcohol, and unprescribed drugs. These chemicals affect the formation and growth of the baby.  Follow your caregiver's instructions regarding medicine use. There are medicines that are either safe or unsafe to take during pregnancy.  Exercise only as directed by your caregiver. Experiencing uterine cramps is a good sign to stop exercising.  Continue to eat regular, healthy meals.  Wear a good support bra for breast tenderness.  Do not use hot tubs, steam rooms, or saunas.  Wear your seat belt at all times when driving.  Avoid raw meat, uncooked cheese, cat litter boxes, and soil used by cats. These carry germs that can cause birth defects in the baby.  Take your prenatal vitamins.  Try taking a stool softener (if your caregiver approves) if you develop constipation. Eat more high-fiber foods, such as fresh vegetables or fruit and whole grains. Drink plenty of fluids to keep your urine clear or pale yellow.  Take warm sitz baths to soothe any pain or discomfort caused by hemorrhoids. Use hemorrhoid cream if your caregiver approves.  If you develop varicose veins, wear support hose. Elevate your feet for 15 minutes, 3-4 times a day. Limit salt in your diet.  Avoid heavy lifting, wear low heal shoes, and practice good posture.  Rest a lot with your legs elevated if you have leg cramps or low back pain.  Visit your dentist if you have not gone during your pregnancy. Use a soft toothbrush to brush your teeth and be gentle when you floss.  A sexual relationship may be continued unless your caregiver directs you otherwise.  Do not travel far distances unless it is absolutely necessary and only with the approval  of your caregiver.  Take prenatal classes to understand, practice, and ask questions about the labor and delivery.  Make a trial run to the hospital.  Pack your hospital bag.  Prepare the baby's nursery.  Continue to go to all your prenatal visits as directed by your caregiver. SEEK MEDICAL CARE IF:  You are unsure if you are in labor or if your water has broken.  You have dizziness.  You have mild pelvic cramps, pelvic pressure, or nagging pain in your abdominal area.  You have persistent nausea, vomiting, or diarrhea.  You have a bad smelling vaginal discharge.  You have pain with urination. SEEK IMMEDIATE MEDICAL CARE IF:   You have a fever.  You are leaking fluid from your vagina.  You have spotting or bleeding from your vagina.  You have severe abdominal cramping or pain.  You have rapid weight loss or gain.  You have shortness of breath with chest pain.  You notice sudden or extreme swelling   of your face, hands, ankles, feet, or legs.  You have not felt your baby move in over an hour.  You have severe headaches that do not go away with medicine.  You have vision changes. Document Released: 04/30/2001 Document Revised: 05/11/2013 Document Reviewed: 07/07/2012 Exit[employer] Patient Information 2015 [employer]. This information is not intended to replace advice given to you by your health care provider. Make sure you discuss any questions you have with your health care provider.  

## 2014-08-22 NOTE — MAU Note (Signed)
Decreased movement aver the day. Kick 10 over 2 hours.  Concerned ultrasound (tiny toes)shows cord around neck.

## 2014-09-01 ENCOUNTER — Encounter (HOSPITAL_COMMUNITY): Payer: Self-pay | Admitting: *Deleted

## 2014-09-01 ENCOUNTER — Inpatient Hospital Stay (HOSPITAL_COMMUNITY)
Admission: AD | Admit: 2014-09-01 | Discharge: 2014-09-01 | Disposition: A | Discharge status: Home / Self Care | Payer: 59 | Source: Ambulatory Visit | Attending: Obstetrics and Gynecology | Admitting: Obstetrics and Gynecology

## 2014-09-01 DIAGNOSIS — Z3A33 33 weeks gestation of pregnancy: Secondary | ICD-10-CM

## 2014-09-01 DIAGNOSIS — O4703 False labor before 37 completed weeks of gestation, third trimester: Secondary | ICD-10-CM | POA: Diagnosis not present

## 2014-09-01 DIAGNOSIS — Z3A32 32 weeks gestation of pregnancy: Secondary | ICD-10-CM | POA: Insufficient documentation

## 2014-09-01 DIAGNOSIS — R109 Unspecified abdominal pain: Secondary | ICD-10-CM | POA: Insufficient documentation

## 2014-09-01 DIAGNOSIS — O2343 Unspecified infection of urinary tract in pregnancy, third trimester: Secondary | ICD-10-CM

## 2014-09-01 LAB — WET PREP, GENITAL
Clue Cells Wet Prep HPF POC: NONE SEEN
Trich, Wet Prep: NONE SEEN
Yeast Wet Prep HPF POC: NONE SEEN

## 2014-09-01 LAB — URINE MICROSCOPIC-ADD ON

## 2014-09-01 LAB — URINALYSIS, ROUTINE W REFLEX MICROSCOPIC
Bilirubin Urine: NEGATIVE
Glucose, UA: NEGATIVE mg/dL
Ketones, ur: NEGATIVE mg/dL
NITRITE: NEGATIVE
Protein, ur: NEGATIVE mg/dL
Specific Gravity, Urine: 1.025 (ref 1.005–1.030)
UROBILINOGEN UA: 0.2 mg/dL (ref 0.0–1.0)
pH: 6.5 (ref 5.0–8.0)

## 2014-09-01 LAB — AMNISURE RUPTURE OF MEMBRANE (ROM) NOT AT ARMC: Amnisure ROM: NEGATIVE

## 2014-09-01 MED ORDER — NITROFURANTOIN MONOHYD MACRO 100 MG PO CAPS: 100.0000 mg | ORAL_CAPSULE | Freq: Two times a day (BID) | ORAL | Status: DC

## 2014-09-01 NOTE — Discharge Instructions (Signed)
Pregnancy and Urinary Tract Infection °A urinary tract infection (UTI) is a bacterial infection of the urinary tract. Infection of the urinary tract can include the ureters, kidneys (pyelonephritis), bladder (cystitis), and urethra (urethritis). All pregnant women should be screened for bacteria in the urinary tract. Identifying and treating a UTI will decrease the risk of preterm labor and developing more serious infections in both the mother and baby. °CAUSES °Bacteria germs cause almost all UTIs.  °RISK FACTORS °Many factors can increase your chances of getting a UTI during pregnancy. These include: °· Having a short urethra. °· Poor toilet and hygiene habits. °· Sexual intercourse. °· Blockage of urine along the urinary tract. °· Problems with the pelvic muscles or nerves. °· Diabetes. °· Obesity. °· Bladder problems after having several children. °· Previous history of UTI. °SIGNS AND SYMPTOMS  °· Pain, burning, or a stinging feeling when urinating. °· Suddenly feeling the need to urinate right away (urgency). °· Loss of bladder control (urinary incontinence). °· Frequent urination, more than is common with pregnancy. °· Lower abdominal or back discomfort. °· Cloudy urine. °· Blood in the urine (hematuria). °· Fever.  °When the kidneys are infected, the symptoms may be: °· Back pain. °· Flank pain on the right side more so than the left. °· Fever. °· Chills. °· Nausea. °· Vomiting. °DIAGNOSIS  °A urinary tract infection is usually diagnosed through urine tests. Additional tests and procedures are sometimes done. These may include: °· Ultrasound exam of the kidneys, ureters, bladder, and urethra. °· Looking in the bladder with a lighted tube (cystoscopy). °TREATMENT °Typically, UTIs can be treated with antibiotic medicines.  °HOME CARE INSTRUCTIONS  °· Only take over-the-counter or prescription medicines as directed by your health care provider. If you were prescribed antibiotics, take them as directed. Finish  them even if you start to feel better. °· Drink enough fluids to keep your urine clear or pale yellow. °· Do not have sexual intercourse until the infection is gone and your health care provider says it is okay. °· Make sure you are tested for UTIs throughout your pregnancy. These infections often come back.  °Preventing a UTI in the Future °· Practice good toilet habits. Always wipe from front to back. Use the tissue only once. °· Do not hold your urine. Empty your bladder as soon as possible when the urge comes. °· Do not douche or use deodorant sprays. °· Wash with soap and warm water around the genital area and the anus. °· Empty your bladder before and after sexual intercourse. °· Wear underwear with a cotton crotch. °· Avoid caffeine and carbonated drinks. They can irritate the bladder. °· Drink cranberry juice or take cranberry pills. This may decrease the risk of getting a UTI. °· Do not drink alcohol. °· Keep all your appointments and tests as scheduled.  °SEEK MEDICAL CARE IF:  °· Your symptoms get worse. °· You are still having fevers 2 or more days after treatment begins. °· You have a rash. °· You feel that you are having problems with medicines prescribed. °· You have abnormal vaginal discharge. °SEEK IMMEDIATE MEDICAL CARE IF:  °· You have back or flank pain. °· You have chills. °· You have blood in your urine. °· You have nausea and vomiting. °· You have contractions of your uterus. °· You have a gush of fluid from the vagina. °MAKE SURE YOU: °· Understand these instructions.   °· Will watch your condition.   °· Will get help right away if you are not doing   well or get worse.   °Document Released: 08/31/2010 Document Revised: 02/24/2013 Document Reviewed: 12/03/2012 °[employer]® Patient Information ©2015 [employer]. This information is not intended to replace advice given to you by your health care provider. Make sure you discuss any questions you have with your health care provider. ° °

## 2014-09-01 NOTE — MAU Note (Signed)
Pt C/O strong cramping in lower abd for the past 5 hours, started leaking clear fluid that soaked through her pants around 1300.  Denies bleeding.

## 2014-09-01 NOTE — MAU Provider Note (Signed)
Chief Complaint:  Abdominal Pain and Rupture of Membranes   First Provider Initiated Contact with Patient 09/01/14 1638      HPI: Sheryl Wright is a 24 y.o. G1P0 at 785w2d who presents to maternity admissions reporting onset of cramping early this morning, then gush of fluid while at work at 1:00 pm, with clear fluid soaking her underwear and onto her pants a small amount. This occurred once, but she does report feeling wet, but not requiring a pad for this leakage.  She reports good fetal movement, denies vaginal bleeding, vaginal itching/burning, urinary symptoms, h/a, dizziness, n/v, or fever/chills.    Abdominal Pain This is a new problem. The current episode started today. The onset quality is sudden. The problem occurs intermittently. The most recent episode lasted 1 day. The problem has been waxing and waning. The pain is located in the generalized abdominal region. The pain is moderate. The quality of the pain is cramping. The abdominal pain radiates to the back. Pertinent negatives include no constipation, dysuria, fever, frequency, nausea or vomiting. The pain is aggravated by certain positions. The pain is relieved by certain positions. She has tried nothing for the symptoms.    Past Medical History: Past Medical History  Diagnosis Date  . Anxiety   . PCOS (polycystic ovarian syndrome)     Past obstetric history: OB History  Gravida Para Term Preterm AB SAB TAB Ectopic Multiple Living  1             # Outcome Date GA Lbr Len/2nd Weight Sex Delivery Anes PTL Lv  1 Current               Past Surgical History: Past Surgical History  Procedure Laterality Date  . Tonsillitis    . Wisdom tooth extraction    . Tonsillectomy      Family History: Family History  Problem Relation Age of Onset  . Crohn's disease Sister     Social History: History  Substance Use Topics  . Smoking status: Never Smoker   . Smokeless tobacco: Never Used  . Alcohol Use: 1.5 oz/week    3  drink(s) per week     Comment: not during pregnancy    Allergies:  Allergies  Allergen Reactions  . Pertussis Vaccines Hives  . Penicillins Itching  . Tape Rash    Meds:  Prescriptions prior to admission  Medication Sig Dispense Refill Last Dose  . acetaminophen (TYLENOL) 325 MG tablet Take 650 mg by mouth every 6 (six) hours as needed for headache.    Past Week at Unknown time  . calcium carbonate (TUMS - DOSED IN MG ELEMENTAL CALCIUM) 500 MG chewable tablet Chew 2-3 tablets by mouth 3 (three) times daily as needed for indigestion or heartburn.   09/01/2014 at Unknown time  . FLUoxetine (PROZAC) 20 MG tablet Take 20 mg by mouth every other day. Alternates between 40mg  and 20mg  to average 30mg    Past Week at Unknown time  . FLUoxetine (PROZAC) 40 MG capsule Take 40 mg by mouth every other day.   08/31/2014 at Unknown time  . Prenatal Vit-Fe Fumarate-FA (PRENATAL MULTIVITAMIN) TABS tablet Take 1 tablet by mouth daily at 12 noon.   Past Week at Unknown time  . tetrahydrozoline-zinc (VISINE-AC) 0.05-0.25 % ophthalmic solution Place 1-2 drops into both eyes 3 (three) times daily as needed.   Past Month at Unknown time  . promethazine (PHENERGAN) 25 MG tablet Take 0.5-1 tablets (12.5-25 mg total) by mouth every 6 (  six) hours as needed. (Patient not taking: Reported on 08/22/2014) 30 tablet 0 Not Taking at Unknown time   Review of Systems  Constitutional: Negative for fever and chills.  Respiratory: Negative for shortness of breath.   Gastrointestinal: Positive for abdominal pain. Negative for nausea, vomiting and constipation.  Genitourinary: Negative for dysuria and frequency.    Physical Exam  Blood pressure 119/76, pulse 115, temperature 97.8 F (36.6 C), temperature source Oral, resp. rate 20, last menstrual period 01/18/2014. GENERAL: Well-developed, well-nourished female in no acute distress.  HEENT: normocephalic HEART: normal rate RESP: normal effort ABDOMEN: Soft, non-tender,  gravid appropriate for gestational age EXTREMITIES: Nontender, no edema NEURO: alert and oriented Pelvic exam: Cervix pink, visually closed, without lesion, moderate amount white creamy discharge, negative pooling of fluid, vaginal walls and external genitalia normal  Dilation: Closed Effacement (%): Thick Cervical Position: Posterior Exam by:: L. Leftwich-Kirby CNM  FHT:  Baseline 135, moderate variability, accelerations present, no decelerations Contractions: q 2-10 mins, irregular, with irritability in between   Labs: Results for orders placed or performed during the hospital encounter of 09/01/14 (from the past 24 hour(s))  Urinalysis, Routine w reflex microscopic     Status: Abnormal   Collection Time: 09/01/14  3:48 PM  Result Value Ref Range   Color, Urine YELLOW YELLOW   APPearance CLEAR CLEAR   Specific Gravity, Urine 1.025 1.005 - 1.030   pH 6.5 5.0 - 8.0   Glucose, UA NEGATIVE NEGATIVE mg/dL   Hgb urine dipstick TRACE (A) NEGATIVE   Bilirubin Urine NEGATIVE NEGATIVE   Ketones, ur NEGATIVE NEGATIVE mg/dL   Protein, ur NEGATIVE NEGATIVE mg/dL   Urobilinogen, UA 0.2 0.0 - 1.0 mg/dL   Nitrite NEGATIVE NEGATIVE   Leukocytes, UA MODERATE (A) NEGATIVE  Urine microscopic-add on     Status: Abnormal   Collection Time: 09/01/14  3:48 PM  Result Value Ref Range   Squamous Epithelial / LPF FEW (A) RARE   WBC, UA 0-2 <3 WBC/hpf   RBC / HPF 0-2 <3 RBC/hpf   Crystals CA OXALATE CRYSTALS (A) NEGATIVE  Amnisure rupture of membrane (rom)     Status: None   Collection Time: 09/01/14  4:48 PM  Result Value Ref Range   Amnisure ROM NEGATIVE   Wet prep, genital     Status: Abnormal   Collection Time: 09/01/14  4:48 PM  Result Value Ref Range   Yeast Wet Prep HPF POC NONE SEEN NONE SEEN   Trich, Wet Prep NONE SEEN NONE SEEN   Clue Cells Wet Prep HPF POC NONE SEEN NONE SEEN   WBC, Wet Prep HPF POC MANY (A) NONE SEEN    Assessment: 1. UTI in pregnancy, antepartum, third trimester    2. Preterm contractions, third trimester     Plan: Pt reports improved symptoms after PO fluids in MAU Consult Dr Rana Snare Discharge home PTL precautions and fetal kick counts Macrobid 100 mg BID x 7 days Urine sent for culture      Follow-up Information    Follow up with Meriel Pica, MD.   Specialty:  Obstetrics and Gynecology   Why:  As scheduled on Tuesday   Contact information:   [address] [place] [address] [phone]       Follow up with [facility] [place] [facility]ERN[facility] ADMISS[facility].   Why:  As needed for emergencies   Contact information:   [address] [id number] mc [place] [address] [phone]  Medication List    STOP taking these medications        promethazine 25 MG tablet  Commonly known as:  PHENERGAN      TAKE these medications        acetaminophen 325 MG tablet  Commonly known as:  TYLENOL  Take 650 mg by mouth every 6 (six) hours as needed for headache.     calcium carbonate 500 MG chewable tablet  Commonly known as:  TUMS - dosed in mg elemental calcium  Chew 2-3 tablets by mouth 3 (three) times daily as needed for indigestion or heartburn.     FLUoxetine 40 MG capsule  Commonly known as:  PROZAC  Take 40 mg by mouth every other day.     FLUoxetine 20 MG tablet  Commonly known as:  PROZAC  Take 20 mg by mouth every other day. Alternates between  and  to average      nitrofurantoin (macrocrystal-monohydrate) 100 MG capsule  Commonly known as:  MACROBID  Take 1 capsule (100 mg total) by mouth 2 (two) times daily.     prenatal multivitamin Tabs tablet  Take 1 tablet by mouth daily at 12 noon.     tetrahydrozoline-zinc 0.05-0.25 % ophthalmic solution  Commonly known as:  VISINE-AC  Place 1-2 drops into both eyes 3 (three) times daily as needed.        Sharen Counter Certified Nurse-Midwife 09/01/2014 6:22 PM

## 2014-09-02 LAB — CULTURE, OB URINE

## 2014-09-19 ENCOUNTER — Encounter (HOSPITAL_COMMUNITY): Payer: Self-pay | Admitting: *Deleted

## 2014-09-19 ENCOUNTER — Inpatient Hospital Stay (HOSPITAL_COMMUNITY)
Admission: AD | Admit: 2014-09-19 | Discharge: 2014-09-19 | Disposition: A | Discharge status: Home / Self Care | Payer: 59 | Source: Ambulatory Visit | Attending: Obstetrics and Gynecology | Admitting: Obstetrics and Gynecology

## 2014-09-19 DIAGNOSIS — Z3A34 34 weeks gestation of pregnancy: Secondary | ICD-10-CM | POA: Insufficient documentation

## 2014-09-19 DIAGNOSIS — Z3A35 35 weeks gestation of pregnancy: Secondary | ICD-10-CM | POA: Diagnosis not present

## 2014-09-19 DIAGNOSIS — O4703 False labor before 37 completed weeks of gestation, third trimester: Secondary | ICD-10-CM | POA: Diagnosis not present

## 2014-09-19 LAB — URINALYSIS, ROUTINE W REFLEX MICROSCOPIC
Bilirubin Urine: NEGATIVE
Glucose, UA: NEGATIVE mg/dL
Hgb urine dipstick: NEGATIVE
KETONES UR: NEGATIVE mg/dL
Leukocytes, UA: NEGATIVE
Nitrite: NEGATIVE
PH: 6.5 (ref 5.0–8.0)
PROTEIN: NEGATIVE mg/dL
SPECIFIC GRAVITY, URINE: 1.01 (ref 1.005–1.030)
Urobilinogen, UA: 0.2 mg/dL (ref 0.0–1.0)

## 2014-09-19 MED ORDER — NIFEDIPINE 10 MG PO CAPS
10.0000 mg | ORAL_CAPSULE | Freq: Once | ORAL | Status: AC
Start: 2014-09-19 — End: 2014-09-19
  Administered 2014-09-19: 10 mg via ORAL
  Filled 2014-09-19: qty 1

## 2014-09-19 MED ORDER — NIFEDIPINE 10 MG PO CAPS
10.0000 mg | ORAL_CAPSULE | Freq: Once | ORAL | Status: AC
  Administered 2014-09-19: 10 mg via ORAL
  Filled 2014-09-19: qty 1

## 2014-09-19 NOTE — Discharge Instructions (Signed)
Braxton Hicks Contractions °Contractions of the uterus can occur throughout pregnancy. Contractions are not always a sign that you are in labor.  °WHAT ARE BRAXTON HICKS CONTRACTIONS?  °Contractions that occur before labor are called Braxton Hicks contractions, or false labor. Toward the end of pregnancy (32-34 weeks), these contractions can develop more often and may become more forceful. This is not true labor because these contractions do not result in opening (dilatation) and thinning of the cervix. They are sometimes difficult to tell apart from true labor because these contractions can be forceful and people have different pain tolerances. You should not feel embarrassed if you go to the hospital with false labor. Sometimes, the only way to tell if you are in true labor is for your health care provider to look for changes in the cervix. °If there are no prenatal problems or other health problems associated with the pregnancy, it is completely safe to be sent home with false labor and await the onset of true labor. °HOW CAN YOU TELL THE DIFFERENCE BETWEEN TRUE AND FALSE LABOR? °False Labor °· The contractions of false labor are usually shorter and not as hard as those of true labor.   °· The contractions are usually irregular.   °· The contractions are often felt in the front of the lower abdomen and in the groin.   °· The contractions may go away when you walk around or change positions while lying down.   °· The contractions get weaker and are shorter lasting as time goes on.   °· The contractions do not usually become progressively stronger, regular, and closer together as with true labor.   °True Labor °· Contractions in true labor last 30-70 seconds, become very regular, usually become more intense, and increase in frequency.   °· The contractions do not go away with walking.   °· The discomfort is usually felt in the top of the uterus and spreads to the lower abdomen and low back.   °· True labor can be  determined by your health care provider with an exam. This will show that the cervix is dilating and getting thinner.   °WHAT TO REMEMBER °· Keep up with your usual exercises and follow other instructions given by your health care provider.   °· Take medicines as directed by your health care provider.   °· Keep your regular prenatal appointments.   °· Eat and drink lightly if you think you are going into labor.   °· If Braxton Hicks contractions are making you uncomfortable:   °¨ Change your position from lying down or resting to walking, or from walking to resting.   °¨ Sit and rest in a tub of warm water.   °¨ Drink 2-3 glasses of water. Dehydration may cause these contractions.   °¨ Do slow and deep breathing several times an hour.   °WHEN SHOULD I SEEK IMMEDIATE MEDICAL CARE? °Seek immediate medical care if: °· Your contractions become stronger, more regular, and closer together.   °· You have fluid leaking or gushing from your vagina.   °· You have a fever.   °· You pass blood-tinged mucus.   °· You have vaginal bleeding.   °· You have continuous abdominal pain.   °· You have low back pain that you never had before.   °· You feel your baby's head pushing down and causing pelvic pressure.   °· Your baby is not moving as much as it used to.   °Document Released: 05/06/2005 Document Revised: 05/11/2013 Document Reviewed: 02/15/2013 °[employer]® Patient Information ©2015 [employer]. This information is not intended to replace advice given to you by your health care   provider. Make sure you discuss any questions you have with your health care provider. ° °

## 2014-09-19 NOTE — MAU Provider Note (Signed)
History     CSN: [id number]  Arrival date and time: 09/19/14 0244   First Provider Initiated Contact with Patient 09/19/14 [phone]      Chief Complaint  Patient presents with  . Contractions   HPI [name] is a 24 y.o. G1P0 at [redacted]w[redacted]d here with report of contractions that started around 2300 last night after intercourse.  Pt denies vaginal bleeding or leaking of fluid.  +fetal movement.     Past Medical History  Diagnosis Date  . Anxiety   . PCOS (polycystic ovarian syndrome)     Past Surgical History  Procedure Laterality Date  . Tonsillitis    . Wisdom tooth extraction    . Tonsillectomy    . Adenoidectomy  1996    Family History  Problem Relation Age of Onset  . Crohn's disease Sister   . Cancer Maternal Grandmother     breast  . Heart attack Maternal Grandfather   . Hypertension Maternal Grandfather   . Cancer Paternal Grandmother     melanoma  . Rheum arthritis Paternal Grandfather     History  Substance Use Topics  . Smoking status: Never Smoker   . Smokeless tobacco: Never Used  . Alcohol Use: 1.5 oz/week    3 Standard drinks or equivalent per week     Comment: not during pregnancy    Allergies:  Allergies  Allergen Reactions  . Pertussis Vaccines Hives  . Penicillins Itching  . Tape Rash    Prescriptions prior to admission  Medication Sig Dispense Refill Last Dose  . acetaminophen (TYLENOL) 325 MG tablet Take 650 mg by mouth every 6 (six) hours as needed for headache.    Past Month at Unknown time  . calcium carbonate (TUMS - DOSED IN MG ELEMENTAL CALCIUM) 500 MG chewable tablet Chew 2-3 tablets by mouth 3 (three) times daily as needed for indigestion or heartburn.   09/19/2014 at Unknown time  . FLUoxetine (PROZAC) 20 MG tablet Take 20 mg by mouth every other day. Alternates between  and  to average    09/18/2014 at Unknown time  . Prenatal Vit-Fe Fumarate-FA (PRENATAL MULTIVITAMIN) TABS tablet Take 1 tablet by mouth daily at 12  noon.   Past Week at Unknown time  . tetrahydrozoline-zinc (VISINE-AC) 0.05-0.25 % ophthalmic solution Place 1-2 drops into both eyes 3 (three) times daily as needed.   Past Month at Unknown time  . FLUoxetine (PROZAC) 40 MG capsule Take 40 mg by mouth every other day.   08/31/2014 at Unknown time  . nitrofurantoin, macrocrystal-monohydrate, (MACROBID) 100 MG capsule Take 1 capsule (100 mg total) by mouth 2 (two) times daily. 14 capsule 0     Review of Systems  Gastrointestinal: Positive for abdominal pain (contractions).  All other systems reviewed and are negative.  Physical Exam   Blood pressure 116/85, pulse 100, temperature 97.6 F (36.4 C), temperature source Oral, resp. rate 20, height  (1.676 m), weight 91.173 kg (201 lb), last menstrual period 01/18/2014, SpO2 100 %.  Physical Exam  Constitutional: She is oriented to person, place, and time. She appears well-developed and well-nourished. No distress.  HENT:  Head: Normocephalic.  Neck: Normal range of motion. Neck supple.  Cardiovascular: Normal rate, regular rhythm and normal heart sounds.   Respiratory: Effort normal and breath sounds normal.  GI: Soft. There is no tenderness.  Genitourinary: No bleeding in the vagina. Vaginal discharge (mucusy) found.  Musculoskeletal: Normal range of motion. She exhibits no edema.  Neurological:  She is alert and oriented to person, place, and time.  Skin: Skin is warm and dry.   Dilation: Closed Effacement (%): Thick Cervical Position: Posterior Exam by:: Margarita MailW. Karim, CNM  FHR 130's, +accels Toco 3-3.5 min  MAU Course  Procedures  0340 Consulted with Dr. Rana SnareLowe > Reviewed HPI/Exam/contractions > give procardia or terbutaline and discharge home if no cervical change  0345 PO Procardia 10 mg given  0430 Contractions continue every 3-4 min > 2nd dose of procardia 10 mg  Dilation: Closed Effacement (%): Thick Cervical Position: Posterior Exam by:: Margarita MailW. Karim, CNM  [phone] Pt  reports decrease in contractions; asking various questions regarding labor/pregancy; physically does not appear uncomfortable.  Declines terbutaline or IV fluids.  Lives 3 min from hospital.  Desires to go home to sleep.  Appt today in office at 3:50 am   Assessment and Plan  24 y.o. G1P0 at 3519w6d IUP Preterm contractions - no cervical change Reactive NST  Plan: Discharge to home Reviewed preterm labor precautions Return if contractions return or increase in intensity. Keep scheduled appointment for today  Marlis EdelsonWalidah N Karim, CNM

## 2014-09-19 NOTE — MAU Note (Signed)
Pt presents with contractions q2-3 minutes, denies bleeding or ROM

## 2014-10-08 ENCOUNTER — Encounter (HOSPITAL_COMMUNITY): Payer: Self-pay | Admitting: *Deleted

## 2014-10-08 ENCOUNTER — Inpatient Hospital Stay (HOSPITAL_COMMUNITY)
Admission: AD | Admit: 2014-10-08 | Discharge: 2014-10-08 | Disposition: A | Discharge status: Home / Self Care | Payer: 59 | Source: Ambulatory Visit | Attending: Obstetrics and Gynecology | Admitting: Obstetrics and Gynecology

## 2014-10-08 DIAGNOSIS — Z3A37 37 weeks gestation of pregnancy: Secondary | ICD-10-CM | POA: Diagnosis not present

## 2014-10-08 NOTE — MAU Note (Signed)
Contractions since this am. Stronger and closer. Denies LOF or bleeding

## 2014-10-20 ENCOUNTER — Telehealth (HOSPITAL_COMMUNITY): Payer: Self-pay | Admitting: *Deleted

## 2014-10-20 ENCOUNTER — Encounter (HOSPITAL_COMMUNITY): Payer: Self-pay | Admitting: *Deleted

## 2014-10-20 LAB — OB RESULTS CONSOLE GBS: GBS: POSITIVE

## 2014-10-20 NOTE — Telephone Encounter (Signed)
Preadmission screen  

## 2014-10-25 ENCOUNTER — Inpatient Hospital Stay (HOSPITAL_COMMUNITY)
Admission: RE | Admit: 2014-10-25 | Discharge: 2014-10-28 | DRG: 766 | Disposition: A | Discharge status: Home / Self Care | Payer: 59 | Source: Ambulatory Visit | Attending: Obstetrics and Gynecology | Admitting: Obstetrics and Gynecology

## 2014-10-25 ENCOUNTER — Encounter (HOSPITAL_COMMUNITY): Payer: Self-pay

## 2014-10-25 DIAGNOSIS — O480 Post-term pregnancy: Secondary | ICD-10-CM | POA: Diagnosis present

## 2014-10-25 DIAGNOSIS — Z3A40 40 weeks gestation of pregnancy: Secondary | ICD-10-CM | POA: Diagnosis present

## 2014-10-25 DIAGNOSIS — O99344 Other mental disorders complicating childbirth: Secondary | ICD-10-CM | POA: Diagnosis present

## 2014-10-25 DIAGNOSIS — O3483 Maternal care for other abnormalities of pelvic organs, third trimester: Secondary | ICD-10-CM | POA: Diagnosis present

## 2014-10-25 DIAGNOSIS — F419 Anxiety disorder, unspecified: Secondary | ICD-10-CM | POA: Diagnosis present

## 2014-10-25 DIAGNOSIS — O99824 Streptococcus B carrier state complicating childbirth: Secondary | ICD-10-CM | POA: Diagnosis present

## 2014-10-25 DIAGNOSIS — E282 Polycystic ovarian syndrome: Secondary | ICD-10-CM | POA: Diagnosis present

## 2014-10-25 DIAGNOSIS — N858 Other specified noninflammatory disorders of uterus: Secondary | ICD-10-CM | POA: Diagnosis present

## 2014-10-25 DIAGNOSIS — Z3490 Encounter for supervision of normal pregnancy, unspecified, unspecified trimester: Secondary | ICD-10-CM

## 2014-10-25 LAB — CBC
HEMATOCRIT: 31.6 % — AB (ref 36.0–46.0)
Hemoglobin: 10 g/dL — ABNORMAL LOW (ref 12.0–15.0)
MCH: 26.5 pg (ref 26.0–34.0)
MCHC: 31.6 g/dL (ref 30.0–36.0)
MCV: 83.6 fL (ref 78.0–100.0)
PLATELETS: 241 10*3/uL (ref 150–400)
RBC: 3.78 MIL/uL — ABNORMAL LOW (ref 3.87–5.11)
RDW: 15.1 % (ref 11.5–15.5)
WBC: 15.2 10*3/uL — ABNORMAL HIGH (ref 4.0–10.5)

## 2014-10-25 LAB — TYPE AND SCREEN
ABO/RH(D): O POS
ANTIBODY SCREEN: NEGATIVE

## 2014-10-25 MED ORDER — BUTORPHANOL TARTRATE 1 MG/ML IJ SOLN
1.0000 mg | INTRAMUSCULAR | Status: DC | PRN
  Administered 2014-10-26 (×3): 1 mg via INTRAVENOUS
  Filled 2014-10-25 (×3): qty 1

## 2014-10-25 MED ORDER — FLEET ENEMA 7-19 GM/118ML RE ENEM: 1.0000 | ENEMA | RECTAL | Status: DC | PRN

## 2014-10-25 MED ORDER — LACTATED RINGERS IV SOLN
INTRAVENOUS | Status: DC
  Administered 2014-10-25 – 2014-10-26 (×3): via INTRAVENOUS

## 2014-10-25 MED ORDER — LACTATED RINGERS IV SOLN
500.0000 mL | INTRAVENOUS | Status: DC | PRN
  Administered 2014-10-26 (×2): 1000 mL via INTRAVENOUS

## 2014-10-25 MED ORDER — TERBUTALINE SULFATE 1 MG/ML IJ SOLN: 0.2500 mg | Freq: Once | INTRAMUSCULAR | Status: AC | PRN

## 2014-10-25 MED ORDER — OXYTOCIN BOLUS FROM INFUSION: 500.0000 mL | INTRAVENOUS | Status: DC

## 2014-10-25 MED ORDER — MISOPROSTOL 25 MCG QUARTER TABLET
25.0000 ug | ORAL_TABLET | ORAL | Status: DC | PRN
  Administered 2014-10-25 – 2014-10-26 (×2): 25 ug via VAGINAL
  Filled 2014-10-25 (×3): qty 0.25

## 2014-10-25 MED ORDER — OXYTOCIN 40 UNITS IN LACTATED RINGERS INFUSION - SIMPLE MED
1.0000 m[IU]/min | INTRAVENOUS | Status: DC
  Administered 2014-10-26: 2 m[IU]/min via INTRAVENOUS
  Filled 2014-10-25: qty 1000

## 2014-10-25 MED ORDER — OXYCODONE-ACETAMINOPHEN 5-325 MG PO TABS: 2.0000 | ORAL_TABLET | ORAL | Status: DC | PRN

## 2014-10-25 MED ORDER — CITRIC ACID-SODIUM CITRATE 334-500 MG/5ML PO SOLN
30.0000 mL | ORAL | Status: DC | PRN
  Administered 2014-10-26: 30 mL via ORAL
  Filled 2014-10-25: qty 15

## 2014-10-25 MED ORDER — LIDOCAINE HCL (PF) 1 % IJ SOLN: 30.0000 mL | INTRAMUSCULAR | Status: DC | PRN

## 2014-10-25 MED ORDER — OXYTOCIN 40 UNITS IN LACTATED RINGERS INFUSION - SIMPLE MED: 62.5000 mL/h | INTRAVENOUS | Status: DC

## 2014-10-25 MED ORDER — PROMETHAZINE HCL 25 MG/ML IJ SOLN
12.5000 mg | Freq: Four times a day (QID) | INTRAMUSCULAR | Status: DC | PRN
  Administered 2014-10-26: 12.5 mg via INTRAVENOUS
  Filled 2014-10-25: qty 1

## 2014-10-25 MED ORDER — ACETAMINOPHEN 325 MG PO TABS: 650.0000 mg | ORAL_TABLET | ORAL | Status: DC | PRN

## 2014-10-25 MED ORDER — OXYCODONE-ACETAMINOPHEN 5-325 MG PO TABS: 1.0000 | ORAL_TABLET | ORAL | Status: DC | PRN

## 2014-10-25 MED ORDER — ZOLPIDEM TARTRATE 5 MG PO TABS
5.0000 mg | ORAL_TABLET | Freq: Every evening | ORAL | Status: DC | PRN
  Administered 2014-10-26: 5 mg via ORAL
  Filled 2014-10-25: qty 1

## 2014-10-25 MED ORDER — ONDANSETRON HCL 4 MG/2ML IJ SOLN
4.0000 mg | Freq: Four times a day (QID) | INTRAMUSCULAR | Status: DC | PRN
  Administered 2014-10-25: 4 mg via INTRAVENOUS
  Filled 2014-10-25: qty 2

## 2014-10-26 ENCOUNTER — Encounter (HOSPITAL_COMMUNITY): Payer: Self-pay

## 2014-10-26 ENCOUNTER — Inpatient Hospital Stay (HOSPITAL_COMMUNITY): Payer: 59 | Admitting: Anesthesiology

## 2014-10-26 ENCOUNTER — Encounter (HOSPITAL_COMMUNITY)
Admission: RE | Disposition: A | Discharge status: Home / Self Care | Payer: Self-pay | Source: Ambulatory Visit | Attending: Obstetrics and Gynecology

## 2014-10-26 LAB — RPR: RPR Ser Ql: NONREACTIVE

## 2014-10-26 LAB — ABO/RH: ABO/RH(D): O POS

## 2014-10-26 SURGERY — Surgical Case
Anesthesia: Epidural

## 2014-10-26 MED ORDER — OXYCODONE HCL 5 MG PO TABS: 5.0000 mg | ORAL_TABLET | Freq: Once | ORAL | Status: DC | PRN

## 2014-10-26 MED ORDER — FENTANYL 2.5 MCG/ML BUPIVACAINE 1/10 % EPIDURAL INFUSION (WH - ANES)
14.0000 mL/h | INTRAMUSCULAR | Status: DC | PRN
  Administered 2014-10-26 (×2): 14 mL/h via EPIDURAL
  Filled 2014-10-26 (×2): qty 125

## 2014-10-26 MED ORDER — DIBUCAINE 1 % RE OINT: 1.0000 "application " | TOPICAL_OINTMENT | RECTAL | Status: DC | PRN

## 2014-10-26 MED ORDER — KETOROLAC TROMETHAMINE 30 MG/ML IJ SOLN
INTRAMUSCULAR | Status: AC
  Administered 2014-10-26: 30 mg via INTRAVENOUS
  Filled 2014-10-26: qty 1

## 2014-10-26 MED ORDER — PRENATAL MULTIVITAMIN CH
1.0000 | ORAL_TABLET | Freq: Every day | ORAL | Status: DC
  Administered 2014-10-27 – 2014-10-28 (×2): 1 via ORAL
  Filled 2014-10-26 (×2): qty 1

## 2014-10-26 MED ORDER — NALBUPHINE HCL 10 MG/ML IJ SOLN: 5.0000 mg | INTRAMUSCULAR | Status: DC | PRN

## 2014-10-26 MED ORDER — MORPHINE SULFATE 0.5 MG/ML IJ SOLN
INTRAMUSCULAR | Status: AC
  Filled 2014-10-26: qty 10

## 2014-10-26 MED ORDER — SIMETHICONE 80 MG PO CHEW
80.0000 mg | CHEWABLE_TABLET | Freq: Three times a day (TID) | ORAL | Status: DC
  Administered 2014-10-27 – 2014-10-28 (×5): 80 mg via ORAL
  Filled 2014-10-26 (×5): qty 1

## 2014-10-26 MED ORDER — KETOROLAC TROMETHAMINE 30 MG/ML IJ SOLN
30.0000 mg | Freq: Once | INTRAMUSCULAR | Status: AC | PRN
  Administered 2014-10-26: 30 mg via INTRAVENOUS

## 2014-10-26 MED ORDER — ACETAMINOPHEN 325 MG PO TABS: 650.0000 mg | ORAL_TABLET | ORAL | Status: DC | PRN

## 2014-10-26 MED ORDER — FENTANYL 2.5 MCG/ML BUPIVACAINE 1/10 % EPIDURAL INFUSION (WH - ANES): 14.0000 mL/h | INTRAMUSCULAR | Status: DC | PRN

## 2014-10-26 MED ORDER — PHENYLEPHRINE 40 MCG/ML (10ML) SYRINGE FOR IV PUSH (FOR BLOOD PRESSURE SUPPORT)
80.0000 ug | PREFILLED_SYRINGE | INTRAVENOUS | Status: DC | PRN
Start: 2014-10-26 — End: 2014-10-26

## 2014-10-26 MED ORDER — ONDANSETRON HCL 4 MG/2ML IJ SOLN
INTRAMUSCULAR | Status: AC
Start: 2014-10-26 — End: 2014-10-26
  Filled 2014-10-26: qty 2

## 2014-10-26 MED ORDER — NALBUPHINE HCL 10 MG/ML IJ SOLN: 5.0000 mg | Freq: Once | INTRAMUSCULAR | Status: AC | PRN

## 2014-10-26 MED ORDER — NALOXONE HCL 1 MG/ML IJ SOLN
1.0000 ug/kg/h | INTRAMUSCULAR | Status: DC | PRN
  Filled 2014-10-26: qty 2

## 2014-10-26 MED ORDER — OXYTOCIN 10 UNIT/ML IJ SOLN
INTRAMUSCULAR | Status: AC
  Filled 2014-10-26: qty 4

## 2014-10-26 MED ORDER — TETANUS-DIPHTH-ACELL PERTUSSIS 5-2.5-18.5 LF-MCG/0.5 IM SUSP: 0.5000 mL | Freq: Once | INTRAMUSCULAR | Status: DC

## 2014-10-26 MED ORDER — FLEET ENEMA 7-19 GM/118ML RE ENEM: 1.0000 | ENEMA | Freq: Every day | RECTAL | Status: DC | PRN

## 2014-10-26 MED ORDER — LACTATED RINGERS IV SOLN
INTRAVENOUS | Status: DC
  Administered 2014-10-26 – 2014-10-27 (×2): via INTRAVENOUS

## 2014-10-26 MED ORDER — SIMETHICONE 80 MG PO CHEW
80.0000 mg | CHEWABLE_TABLET | ORAL | Status: DC
  Administered 2014-10-27 (×2): 80 mg via ORAL
  Filled 2014-10-26 (×2): qty 1

## 2014-10-26 MED ORDER — FLUOXETINE HCL 20 MG PO CAPS
40.0000 mg | ORAL_CAPSULE | ORAL | Status: DC
  Filled 2014-10-26: qty 2

## 2014-10-26 MED ORDER — SCOPOLAMINE 1 MG/3DAYS TD PT72: 1.0000 | MEDICATED_PATCH | Freq: Once | TRANSDERMAL | Status: DC

## 2014-10-26 MED ORDER — EPHEDRINE 5 MG/ML INJ: 10.0000 mg | INTRAVENOUS | Status: DC | PRN

## 2014-10-26 MED ORDER — BISACODYL 10 MG RE SUPP: 10.0000 mg | Freq: Every day | RECTAL | Status: DC | PRN

## 2014-10-26 MED ORDER — GENTAMICIN SULFATE 40 MG/ML IJ SOLN: 1.5000 mg/kg | Freq: Once | INTRAMUSCULAR | Status: DC

## 2014-10-26 MED ORDER — LANOLIN HYDROUS EX OINT: 1.0000 "application " | TOPICAL_OINTMENT | CUTANEOUS | Status: DC | PRN

## 2014-10-26 MED ORDER — OXYTOCIN 10 UNIT/ML IJ SOLN
40.0000 [IU] | INTRAVENOUS | Status: DC | PRN
  Administered 2014-10-26: 40 [IU] via INTRAVENOUS

## 2014-10-26 MED ORDER — OXYCODONE-ACETAMINOPHEN 5-325 MG PO TABS
1.0000 | ORAL_TABLET | ORAL | Status: DC | PRN
  Administered 2014-10-27 – 2014-10-28 (×3): 1 via ORAL
  Filled 2014-10-26 (×3): qty 1

## 2014-10-26 MED ORDER — DIPHENHYDRAMINE HCL 50 MG/ML IJ SOLN: 12.5000 mg | INTRAMUSCULAR | Status: DC | PRN

## 2014-10-26 MED ORDER — LIDOCAINE HCL (PF) 1 % IJ SOLN
INTRAMUSCULAR | Status: DC | PRN
  Administered 2014-10-26 (×2): 4 mL

## 2014-10-26 MED ORDER — ZOLPIDEM TARTRATE 5 MG PO TABS: 5.0000 mg | ORAL_TABLET | Freq: Every evening | ORAL | Status: DC | PRN

## 2014-10-26 MED ORDER — SENNOSIDES-DOCUSATE SODIUM 8.6-50 MG PO TABS
2.0000 | ORAL_TABLET | ORAL | Status: DC
  Administered 2014-10-27 (×2): 2 via ORAL
  Filled 2014-10-26 (×2): qty 2

## 2014-10-26 MED ORDER — DIPHENHYDRAMINE HCL 25 MG PO CAPS: 25.0000 mg | ORAL_CAPSULE | Freq: Four times a day (QID) | ORAL | Status: DC | PRN

## 2014-10-26 MED ORDER — MEPERIDINE HCL 25 MG/ML IJ SOLN
INTRAMUSCULAR | Status: AC
  Filled 2014-10-26: qty 1

## 2014-10-26 MED ORDER — 0.9 % SODIUM CHLORIDE (POUR BTL) OPTIME
TOPICAL | Status: DC | PRN
  Administered 2014-10-26: 300 mL

## 2014-10-26 MED ORDER — OXYCODONE-ACETAMINOPHEN 5-325 MG PO TABS
2.0000 | ORAL_TABLET | ORAL | Status: DC | PRN
  Administered 2014-10-27 – 2014-10-28 (×5): 2 via ORAL
  Filled 2014-10-26 (×5): qty 2

## 2014-10-26 MED ORDER — LACTATED RINGERS IV BOLUS (SEPSIS): 500.0000 mL | Freq: Once | INTRAVENOUS | Status: DC

## 2014-10-26 MED ORDER — OXYTOCIN 40 UNITS IN LACTATED RINGERS INFUSION - SIMPLE MED: 62.5000 mL/h | INTRAVENOUS | Status: AC

## 2014-10-26 MED ORDER — DIPHENHYDRAMINE HCL 50 MG/ML IJ SOLN
INTRAMUSCULAR | Status: DC | PRN
  Administered 2014-10-26: 25 mg via INTRAVENOUS

## 2014-10-26 MED ORDER — PHENYLEPHRINE 40 MCG/ML (10ML) SYRINGE FOR IV PUSH (FOR BLOOD PRESSURE SUPPORT)
80.0000 ug | PREFILLED_SYRINGE | INTRAVENOUS | Status: DC | PRN
  Filled 2014-10-26: qty 20

## 2014-10-26 MED ORDER — WITCH HAZEL-GLYCERIN EX PADS: 1.0000 "application " | MEDICATED_PAD | CUTANEOUS | Status: DC | PRN

## 2014-10-26 MED ORDER — NALOXONE HCL 0.4 MG/ML IJ SOLN: 0.4000 mg | INTRAMUSCULAR | Status: DC | PRN

## 2014-10-26 MED ORDER — SODIUM CHLORIDE 0.9 % IJ SOLN: 3.0000 mL | Freq: Two times a day (BID) | INTRAMUSCULAR | Status: DC

## 2014-10-26 MED ORDER — ONDANSETRON HCL 4 MG/2ML IJ SOLN: 4.0000 mg | Freq: Three times a day (TID) | INTRAMUSCULAR | Status: DC | PRN

## 2014-10-26 MED ORDER — MEPERIDINE HCL 25 MG/ML IJ SOLN: 6.2500 mg | INTRAMUSCULAR | Status: DC | PRN

## 2014-10-26 MED ORDER — LIDOCAINE-EPINEPHRINE (PF) 2 %-1:200000 IJ SOLN
INTRAMUSCULAR | Status: DC | PRN
  Administered 2014-10-26 (×3): 5 mL via EPIDURAL

## 2014-10-26 MED ORDER — MEASLES, MUMPS & RUBELLA VAC ~~LOC~~ INJ
0.5000 mL | INJECTION | Freq: Once | SUBCUTANEOUS | Status: DC
  Filled 2014-10-26: qty 0.5

## 2014-10-26 MED ORDER — MENTHOL 3 MG MT LOZG: 1.0000 | LOZENGE | OROMUCOSAL | Status: DC | PRN

## 2014-10-26 MED ORDER — PANTOPRAZOLE SODIUM 40 MG PO TBEC
40.0000 mg | DELAYED_RELEASE_TABLET | Freq: Every day | ORAL | Status: DC
  Filled 2014-10-26: qty 1

## 2014-10-26 MED ORDER — SODIUM CHLORIDE 0.9 % IJ SOLN: 3.0000 mL | INTRAMUSCULAR | Status: DC | PRN

## 2014-10-26 MED ORDER — DIPHENHYDRAMINE HCL 25 MG PO CAPS
25.0000 mg | ORAL_CAPSULE | ORAL | Status: DC | PRN
  Filled 2014-10-26: qty 1

## 2014-10-26 MED ORDER — GENTAMICIN SULFATE 40 MG/ML IJ SOLN
5.0000 mg/kg | Freq: Once | INTRAVENOUS | Status: AC
  Administered 2014-10-26: 470 mg via INTRAVENOUS
  Filled 2014-10-26: qty 11.75

## 2014-10-26 MED ORDER — SIMETHICONE 80 MG PO CHEW: 80.0000 mg | CHEWABLE_TABLET | ORAL | Status: DC | PRN

## 2014-10-26 MED ORDER — MORPHINE SULFATE (PF) 0.5 MG/ML IJ SOLN
INTRAMUSCULAR | Status: DC | PRN
  Administered 2014-10-26: 4 mg via EPIDURAL

## 2014-10-26 MED ORDER — PROMETHAZINE HCL 25 MG/ML IJ SOLN: 6.2500 mg | INTRAMUSCULAR | Status: DC | PRN

## 2014-10-26 MED ORDER — ONDANSETRON HCL 4 MG/2ML IJ SOLN
INTRAMUSCULAR | Status: DC | PRN
  Administered 2014-10-26: 4 mg via INTRAVENOUS

## 2014-10-26 MED ORDER — LACTATED RINGERS IV SOLN
INTRAVENOUS | Status: DC | PRN
  Administered 2014-10-26: 16:00:00 via INTRAVENOUS

## 2014-10-26 MED ORDER — IBUPROFEN 600 MG PO TABS
600.0000 mg | ORAL_TABLET | Freq: Four times a day (QID) | ORAL | Status: DC | PRN
  Administered 2014-10-27 – 2014-10-28 (×6): 600 mg via ORAL
  Filled 2014-10-26 (×6): qty 1

## 2014-10-26 MED ORDER — KETOROLAC TROMETHAMINE 30 MG/ML IJ SOLN: 30.0000 mg | Freq: Four times a day (QID) | INTRAMUSCULAR | Status: AC | PRN

## 2014-10-26 MED ORDER — MEPERIDINE HCL 25 MG/ML IJ SOLN
INTRAMUSCULAR | Status: DC | PRN
  Administered 2014-10-26: 12.5 mg via INTRAVENOUS

## 2014-10-26 MED ORDER — IBUPROFEN 800 MG PO TABS: 800.0000 mg | ORAL_TABLET | Freq: Three times a day (TID) | ORAL | Status: DC | PRN

## 2014-10-26 MED ORDER — CLINDAMYCIN PHOSPHATE 900 MG/50ML IV SOLN
900.0000 mg | Freq: Three times a day (TID) | INTRAVENOUS | Status: DC
  Administered 2014-10-26 (×2): 900 mg via INTRAVENOUS
  Filled 2014-10-26 (×3): qty 50

## 2014-10-26 MED ORDER — HYDROMORPHONE HCL 1 MG/ML IJ SOLN: 0.2500 mg | INTRAMUSCULAR | Status: DC | PRN

## 2014-10-26 MED ORDER — OXYCODONE HCL 5 MG/5ML PO SOLN: 5.0000 mg | Freq: Once | ORAL | Status: DC | PRN

## 2014-10-26 MED ORDER — SCOPOLAMINE 1 MG/3DAYS TD PT72
MEDICATED_PATCH | TRANSDERMAL | Status: DC | PRN
  Administered 2014-10-26: 1 via TRANSDERMAL

## 2014-10-26 MED ORDER — SODIUM CHLORIDE 0.9 % IV SOLN: 250.0000 mL | INTRAVENOUS | Status: DC

## 2014-10-26 MED ORDER — LACTATED RINGERS IV SOLN
INTRAVENOUS | Status: DC | PRN
  Administered 2014-10-26: 15:00:00 via INTRAVENOUS

## 2014-10-26 SURGICAL SUPPLY — 28 items
CLAMP CORD UMBIL (MISCELLANEOUS) IMPLANT
CLOTH BEACON ORANGE TIMEOUT ST (SAFETY) ×2 IMPLANT
DRAPE SHEET LG 3/4 BI-LAMINATE (DRAPES) IMPLANT
DRSG OPSITE POSTOP 4X10 (GAUZE/BANDAGES/DRESSINGS) ×2 IMPLANT
DURAPREP 26ML APPLICATOR (WOUND CARE) ×2 IMPLANT
ELECT REM PT RETURN 9FT ADLT (ELECTROSURGICAL) ×2
ELECTRODE REM PT RTRN 9FT ADLT (ELECTROSURGICAL) ×1 IMPLANT
EXTRACTOR VACUUM M CUP 4 TUBE (SUCTIONS) IMPLANT
FORMULA NB 0-3 ENFAMIL (FORMULA) ×2 IMPLANT
GLOVE BIO SURGEON STRL SZ7 (GLOVE) ×2 IMPLANT
GOWN STRL REUS W/TWL LRG LVL3 (GOWN DISPOSABLE) ×4 IMPLANT
KIT ABG SYR 3ML LUER SLIP (SYRINGE) IMPLANT
NEEDLE HYPO 25X5/8 SAFETYGLIDE (NEEDLE) ×2 IMPLANT
NS IRRIG 1000ML POUR BTL (IV SOLUTION) ×2 IMPLANT
PACK C SECTION WH (CUSTOM PROCEDURE TRAY) ×2 IMPLANT
PAD OB MATERNITY 4.3X12.25 (PERSONAL CARE ITEMS) ×2 IMPLANT
SET CYSTO W/LG BORE CLAMP LF (SET/KITS/TRAYS/PACK) ×2 IMPLANT
STRIP CLOSURE SKIN 1/2X4 (GAUZE/BANDAGES/DRESSINGS) ×2 IMPLANT
SUT CHROMIC 0 CTX 36 (SUTURE) ×8 IMPLANT
SUT MON AB 4-0 PS1 27 (SUTURE) ×2 IMPLANT
SUT PDS AB 0 CT1 27 (SUTURE) ×4 IMPLANT
SUT VIC AB 3-0 CT1 27 (SUTURE) ×2
SUT VIC AB 3-0 CT1 TAPERPNT 27 (SUTURE) ×2 IMPLANT
SUT VIC AB 3-0 CTX 36 (SUTURE) ×4 IMPLANT
TOWEL OR 17X24 6PK STRL BLUE (TOWEL DISPOSABLE) ×2 IMPLANT
TRAY FOLEY BAG SILVER LF 16FR (SET/KITS/TRAYS/PACK) ×2 IMPLANT
TRAY FOLEY CATH SILVER 14FR (SET/KITS/TRAYS/PACK) ×2 IMPLANT
WATER STERILE IRR 1000ML UROMA (IV SOLUTION) ×2 IMPLANT

## 2014-10-26 NOTE — Anesthesia Postprocedure Evaluation (Signed)
Anesthesia Post Note  Patient: Sheryl Wright  Procedure(s) Performed: Procedure(s) (LRB): CESAREAN SECTION (N/A)  Anesthesia type: Epidural  Patient location: PACU  Post pain: Pain level controlled  Post assessment: Post-op Vital signs reviewed  Last Vitals:  Filed Vitals:   10/26/14 1745  BP: 119/63  Pulse: 85  Temp:   Resp: 16    Post vital signs: Reviewed  Level of consciousness: awake  Complications: No apparent anesthesia complications

## 2014-10-26 NOTE — Op Note (Signed)
Preoperative diagnosis: Fetal intolerance to labor  Postoperative diagnosis: Same  Procedure: Primary low transverse cesarean section  Surgeon: Marcelle OverlieHolland  Anesthesia: Epidural  EBL: 800 cc  Procedure and findings:  Patient taken the operating room after an adequate level of epidural anesthesia was obtained the patient in left tilt position, the abdomen prepped and draped in usual fashion. Foley catheter was arty positioned. She had received clindamycin and gentamicin preoperatively. Appropriate timeouts were taken. Transverse incision made 2 fingers above the symphysis carried down to the fascia which was incised and extended transversely. Rectus muscle divided in the midline, peritoneum entered superiorly without incident and extended in a vertical fashion. The vesicouterine serosa was incised and the bladder was bluntly and sharply dissected below, bladder blade repositioned at that point. Transverse incision made in the lower segment extended with blunt dissection the patient delivered of a 9 lbs. 2 oz. infant in good condition. The infant was suctioned cord clamped and passed the pediatric team for further care. Of interest there were some loop of cord around each foot separately, no knots. The placenta was delivered manually intact was noted to have a secondary lobe was sent to pathology. Cavity was wiped clean with laparotomy pack closure obtained the first layer of 0 chromic in a locked fashion followed by K layer of 0 chromic. There was a small area of incision vertically in the uterine incision which was closed separately. This was above the bladder reflection . This was hemostatic. The bladder was then backfilled with sterile milk was noted to be intact. Bilateral tubes and ovaries were normal noted. The bladder flap was closed just very some of the suture material. Prior to closure sponge, needle, history precast reported as correct 2 peritoneum was enclose a running 3-0 chromic suture. 0 PDS  was then used from laterally to midline on either side to close the fascia a septated tissue was hemostatic 30 Vicryls running suture used to reapproximate the dead space. 4-0 Monocryl subcuticular closure with a honeycomb dressing she tolerated this well went to recovery room in good condition.  Dictated with [facility]  Humaira Sculley Milana ObeyM Lige Lakeman M.D.

## 2014-10-26 NOTE — Progress Notes (Signed)
Now has epid, 4-5>>AROM>>clear fluid, FHR cat 1

## 2014-10-26 NOTE — Progress Notes (Signed)
Recurrence of decels with pushing effort, FHR stable w/o pushing effort>>rec CS for fetal intol to labor, procedure discussed

## 2014-10-26 NOTE — H&P (Signed)
Sheryl DoveKristi E Wright is a 24 y.o. female presenting for labor. Maternal Medical History:  Reason for admission: Contractions.   Contractions: Onset was 3-5 hours ago.   Frequency: regular.   Perceived severity is moderate.    Fetal activity: Perceived fetal activity is normal.      OB History    Gravida Para Term Preterm AB TAB SAB Ectopic Multiple Living   1              Past Medical History  Diagnosis Date  . Anxiety   . PCOS (polycystic ovarian syndrome)   . OCS syndrome    Past Surgical History  Procedure Laterality Date  . Tonsillitis    . Wisdom tooth extraction    . Tonsillectomy    . Adenoidectomy  1996   Family History: family history includes Anemia in her mother; Cancer in her maternal grandmother and paternal grandmother; Crohn's disease in her sister; Heart attack in her maternal grandfather; Hypertension in her maternal grandfather; Rheum arthritis in her paternal grandfather; Urolithiasis in her father. Social History:  reports that she has never smoked. She has never used smokeless tobacco. She reports that she drinks about 1.5 oz of alcohol per week. She reports that she does not use illicit drugs.   Prenatal Transfer Tool  Maternal Diabetes: No Genetic Screening: Normal Maternal Ultrasounds/Referrals: Normal Fetal Ultrasounds or other Referrals:  None Maternal Substance Abuse:  No Significant Maternal Medications:  Meds include: Prozac Significant Maternal Lab Results:  None Other Comments:  None  ROS  Dilation: 2 Effacement (%): 70 Station: -2 Exam by:: Rogelio SeenMcCall, RN  Blood pressure 104/68, pulse 77, temperature 98.3 F (36.8 C), temperature source Oral, resp. rate 18, height 5\' 6"  (1.676 m), weight 208 lb (94.348 kg), last menstrual period 01/18/2014, SpO2 99 %. Exam Physical Exam  Constitutional: She is oriented to person, place, and time. She appears well-developed and well-nourished.  HENT:  Head: Normocephalic and atraumatic.  Neck: Normal range  of motion. Neck supple.  Cardiovascular: Normal rate and regular rhythm.   Respiratory: Effort normal and breath sounds normal.  GI:  Term FH, FHR 148  Genitourinary:  2-3/vtx  Musculoskeletal: Normal range of motion.  Neurological: She is alert and oriented to person, place, and time.  Skin: Skin is warm and dry.    Prenatal labs: ABO, Rh: --/--/O POS, O POS (06/07 2000) Antibody: NEG (06/07 2000) Rubella: Immune (11/05 0000) RPR: Non Reactive (06/07 2000)  HBsAg: Negative (11/05 0000)  HIV: Non-reactive (11/05 0000)  GBS: Positive (06/02 0000)   Assessment/Plan: Term preg, labor GBS abx   Emilio Baylock M 10/26/2014, 8:09 AM

## 2014-10-26 NOTE — Anesthesia Preprocedure Evaluation (Addendum)
Anesthesia Evaluation  Patient identified by MRN, date of birth, ID band Patient awake    Reviewed: Allergy & Precautions, Patient's Chart, lab work & pertinent test results  Airway Mallampati: III  TM Distance: >3 FB Neck ROM: Full    Dental no notable dental hx. (+) Teeth Intact   Pulmonary neg pulmonary ROS,  breath sounds clear to auscultation  Pulmonary exam normal       Cardiovascular negative cardio ROS Normal cardiovascular examRhythm:Regular Rate:Normal     Neuro/Psych Anxiety OCS negative neurological ROS     GI/Hepatic Neg liver ROS, GERD-  Medicated and Controlled,  Endo/Other  Obesity PCOS  Renal/GU negative Renal ROS  negative genitourinary   Musculoskeletal negative musculoskeletal ROS (+)   Abdominal (+) + obese,   Peds  Hematology  (+) anemia ,   Anesthesia Other Findings   Reproductive/Obstetrics (+) Pregnancy                             Anesthesia Physical Anesthesia Plan  ASA: II  Anesthesia Plan: Epidural   Post-op Pain Management:    Induction:   Airway Management Planned: Natural Airway  Additional Equipment:   Intra-op Plan:   Post-operative Plan:   Informed Consent: I have reviewed the patients History and Physical, chart, labs and discussed the procedure including the risks, benefits and alternatives for the proposed anesthesia with the patient or authorized representative who has indicated his/her understanding and acceptance.     Plan Discussed with: CRNA and Surgeon  Anesthesia Plan Comments: (For C/S under epidural)       Anesthesia Quick Evaluation

## 2014-10-26 NOTE — Anesthesia Postprocedure Evaluation (Signed)
  Anesthesia Post-op Note  Patient: Sheryl Wright  Procedure(s) Performed: Procedure(s): CESAREAN SECTION (N/A)  Patient Location: Mother/Baby  Anesthesia Type:Epidural  Level of Consciousness: awake, alert  and oriented  Airway and Oxygen Therapy: Patient Spontanous Breathing  Post-op Pain: none  Post-op Assessment: Post-op Vital signs reviewed and Patient's Cardiovascular Status Stable   LLE Sensation: Tingling   RLE Sensation: Tingling L Sensory Level: T10-Umbilical region R Sensory Level: T10-Umbilical region  Post-op Vital Signs: Reviewed and stable  Last Vitals:  Filed Vitals:   10/26/14 1922  BP: 107/52  Pulse: 83  Temp: 36.9 C  Resp: 18    Complications: No apparent anesthesia complications

## 2014-10-26 NOTE — Progress Notes (Signed)
CTSP re decels, pushing ~ 1 hr, advised to D/C pit>o2>>on exam C/C/LOA with mod caput @ +1, ISE on with + scalp stim and improved pattern. Discussion w/ pt + husband re trial at con t pushing effort as long as FHR stable, they prefer to cont pushing, discussed CS for FTD

## 2014-10-26 NOTE — Transfer of Care (Signed)
Immediate Anesthesia Transfer of Care Note  Patient: Sheryl Wright  Procedure(s) Performed: Procedure(s): CESAREAN SECTION (N/A)  Patient Location: PACU  Anesthesia Type:Epidural  Level of Consciousness: awake, alert , oriented and patient cooperative  Airway & Oxygen Therapy: Patient Spontanous Breathing  Post-op Assessment: Report given to RN and Post -op Vital signs reviewed and stable  Post vital signs: Reviewed and stable  Last Vitals:  Filed Vitals:   10/26/14 1431  BP: 101/54  Pulse: 117  Temp:   Resp:     Complications: No apparent anesthesia complications

## 2014-10-26 NOTE — Addendum Note (Signed)
Addendum  created 10/26/14 1935 by Elgie CongoNataliya H Annabell Oconnor, CRNA   Modules edited: Notes Section   Notes Section:  File: [id number]

## 2014-10-26 NOTE — Anesthesia Procedure Notes (Signed)
Epidural Patient location during procedure: OB Start time: 10/26/2014 7:50 AM  Staffing Anesthesiologist: Mal AmabileFOSTER, Poonam Woehrle Performed by: anesthesiologist   Preanesthetic Checklist Completed: patient identified, site marked, surgical consent, pre-op evaluation, timeout performed, IV checked, risks and benefits discussed and monitors and equipment checked  Epidural Patient position: sitting Prep: site prepped and draped and DuraPrep Patient monitoring: continuous pulse ox and blood pressure Approach: midline Location: L3-L4 Injection technique: LOR air  Needle:  Needle type: Tuohy  Needle gauge: 17 G Needle length: 9 cm and 9 Needle insertion depth: 6 cm Catheter type: closed end flexible Catheter size: 19 Gauge Catheter at skin depth: 11 cm Test dose: negative and Other  Assessment Events: blood not aspirated, injection not painful, no injection resistance, negative IV test and no paresthesia  Additional Notes Patient identified. Risks and benefits discussed including failed block, incomplete  Pain control, post dural puncture headache, nerve damage, paralysis, blood pressure Changes, nausea, vomiting, reactions to medications-both toxic and allergic and post Partum back pain. All questions were answered. Patient expressed understanding and wished to proceed. Sterile technique was used throughout procedure. Epidural site was Dressed with sterile barrier dressing. No paresthesias, signs of intravascular injection Or signs of intrathecal spread were encountered.  Patient was more comfortable after the epidural was dosed. Please see RN's note for documentation of vital signs and FHR which are stable.

## 2014-10-27 ENCOUNTER — Encounter (HOSPITAL_COMMUNITY): Payer: Self-pay | Admitting: Obstetrics and Gynecology

## 2014-10-27 LAB — CBC
HCT: 24.9 % — ABNORMAL LOW (ref 36.0–46.0)
HEMATOCRIT: 23.6 % — AB (ref 36.0–46.0)
HEMOGLOBIN: 7.5 g/dL — AB (ref 12.0–15.0)
HEMOGLOBIN: 8 g/dL — AB (ref 12.0–15.0)
MCH: 26.4 pg (ref 26.0–34.0)
MCH: 27.1 pg (ref 26.0–34.0)
MCHC: 31.8 g/dL (ref 30.0–36.0)
MCHC: 32.1 g/dL (ref 30.0–36.0)
MCV: 83.1 fL (ref 78.0–100.0)
MCV: 84.4 fL (ref 78.0–100.0)
PLATELETS: 177 10*3/uL (ref 150–400)
Platelets: 174 10*3/uL (ref 150–400)
RBC: 2.84 MIL/uL — ABNORMAL LOW (ref 3.87–5.11)
RBC: 2.95 MIL/uL — AB (ref 3.87–5.11)
RDW: 15.3 % (ref 11.5–15.5)
RDW: 15.5 % (ref 11.5–15.5)
WBC: 18.2 10*3/uL — ABNORMAL HIGH (ref 4.0–10.5)
WBC: 20.1 10*3/uL — ABNORMAL HIGH (ref 4.0–10.5)

## 2014-10-27 MED ORDER — FLUOXETINE HCL 20 MG PO CAPS
40.0000 mg | ORAL_CAPSULE | ORAL | Status: DC
  Administered 2014-10-27: 40 mg via ORAL
  Administered 2014-10-28: 20 mg via ORAL
  Filled 2014-10-27: qty 2

## 2014-10-27 MED ORDER — FLUOXETINE HCL 20 MG PO CAPS
20.0000 mg | ORAL_CAPSULE | ORAL | Status: DC
  Filled 2014-10-27: qty 1

## 2014-10-27 NOTE — Progress Notes (Signed)
Subjective: Postpartum Day 1: Cesarean Delivery Patient reports tolerating PO.  Patient able to stand this am without dizziness.  Objective: Vital signs in last 24 hours: Temp:  [97.6 F (36.4 C)-99.9 F (37.7 C)] 98.5 F (36.9 C) (06/09 0526) Pulse Rate:  [75-117] 95 (06/09 0618) Resp:  [16-20] 20 (06/09 0526) BP: (89-125)/(39-82) 102/53 mmHg (06/09 0618) SpO2:  [95 %-100 %] 95 % (06/09 0526)  Physical Exam:  General: alert and cooperative Lochia: appropriate Uterine Fundus: firm Incision: healing well DVT Evaluation: No evidence of DVT seen on physical exam. Negative Homan's sign. No cords or calf tenderness. No significant calf/ankle edema. Foley noted with adequate out put  Recent Labs  10/26/14 2339 10/27/14 0525  HGB 7.5* 8.0*  HCT 23.6* 24.9*    Assessment/Plan: Status post Cesarean section. Doing well postoperatively.  Continue current care. Plan to discontinue foley.  patient reports that she takes Prozac 40 mg alternating with Prozac 20 mg qod Sheryl Wright G 10/27/2014, 8:14 AM

## 2014-10-27 NOTE — Lactation Note (Signed)
This note was copied from the chart of Sheryl Analysa Lobo. Lactation Consultation Note: Lactation Brochure given with basic teaching . Reviewed cue card and proper latch from baby and me book. Mother states infant just finished a 30 mins feeding. Infant being swaddled by father suckling on pacifier. Advised to limit use of pacifier. Mother placed infant back to Rt breast . Infant on and off for 5 mins. Mother states she was finished . Mother reluctant to hands on teaching. Mother states she has a tiny blister on the left nipple. Mother was given comfort gels. Mother has a wide space between her breast. She states that she has been leaking milk for months. Mother has a history of PCOS.  Patient Name: Sheryl Wright [id number] Date: 10/27/2014 Reason for consult: Follow-up assessment   Maternal Data Has patient been taught Hand Expression?: Yes Does the patient have breastfeeding experience prior to this delivery?: No  Feeding Feeding Type: Breast Fed Length of feed: 5 min  LATCH Score/Interventions Latch: Grasps breast easily, tongue down, lips flanged, rhythmical sucking. Intervention(s): Skin to skin  Audible Swallowing: A few with stimulation  Type of Nipple: Everted at rest and after stimulation  Comfort (Breast/Nipple): Soft / non-tender     Hold (Positioning): Assistance needed to correctly position infant at breast and maintain latch. Intervention(s): Breastfeeding basics reviewed;Support Pillows;Position options;Skin to skin  LATCH Score: 8  Lactation Tools Discussed/Used     Consult Status      Michel Bickers 10/27/2014, 3:35 PM

## 2014-10-27 NOTE — Progress Notes (Signed)
Pt became fainty and blood pressure dropped while standing at the bedside.  Pt returned to bed with HOB lowered.  Pt feeling better and blood pressure improved.  Dr Malen Gauze and Dr Marcelle Overlie notified

## 2014-10-27 NOTE — Anesthesia Postprocedure Evaluation (Signed)
Anesthesia Post Note  Patient: Sheryl Wright  Procedure(s) Performed: Procedure(s) (LRB): CESAREAN SECTION (N/A)  Anesthesia type: Epidural  Patient location: Mother/Baby  Post pain: Pain level controlled  Post assessment: Post-op Vital signs reviewed  Last Vitals:  Filed Vitals:   10/27/14 0618  BP: 102/53  Pulse: 95  Temp:   Resp:     Post vital signs: Reviewed  Level of consciousness:alert  Complications: No apparent anesthesia complications

## 2014-10-27 NOTE — Progress Notes (Signed)
MOB was referred for history of depression/anxiety.  Referral is screened out by Clinical Social Worker because none of the following criteria appear to apply: -History of anxiety/depression during this pregnancy, or of post-partum depression. - Diagnosis of anxiety and/or depression within last 3 years or -MOB's symptoms are currently being treated with medication and/or therapy. MOB is currently prescribed Prozac.  Please contact the Clinical Social Worker if needs arise or upon MOB request.  

## 2014-10-27 NOTE — Progress Notes (Signed)
Dr Marcelle Overlie notified of pt's orthostatic B/P while standing.  Orders received

## 2014-10-27 NOTE — Addendum Note (Signed)
Addendum  created 10/27/14 0800 by Algis Greenhouse, CRNA   Modules edited: Notes Section   Notes Section:  File: [id number]

## 2014-10-27 NOTE — Progress Notes (Signed)
Dr Malen Gauze notified of pt orthostatic B/P while standing.  Orders received

## 2014-10-28 MED ORDER — OXYCODONE-ACETAMINOPHEN 5-325 MG PO TABS: 1.0000 | ORAL_TABLET | ORAL | Status: DC | PRN

## 2014-10-28 MED ORDER — IBUPROFEN 600 MG PO TABS: 600.0000 mg | ORAL_TABLET | Freq: Four times a day (QID) | ORAL | Status: DC | PRN

## 2014-10-28 NOTE — Progress Notes (Signed)
Subjective: Postpartum Day 2: Cesarean Delivery Patient reports tolerating PO and no problems voiding.    Objective: Vital signs in last 24 hours: Temp:  [97.6 F (36.4 C)-98.2 F (36.8 C)] 97.9 F (36.6 C) (06/10 6010) Pulse Rate:  [82-103] 103 (06/10 0608) Resp:  [18] 18 (06/10 0608) BP: (93-113)/(38-60) 109/60 mmHg (06/10 0608) SpO2:  [97 %-98 %] 97 % (06/09 1330)  Physical Exam:  General: alert and cooperative Lochia: appropriate Uterine Fundus: firm Incision: healing well DVT Evaluation: No evidence of DVT seen on physical exam. Negative Homan's sign. No cords or calf tenderness. No significant calf/ankle edema.   Recent Labs  10/26/14 2339 10/27/14 0525  HGB 7.5* 8.0*  HCT 23.6* 24.9*    Assessment/Plan: Status post Cesarean section. Doing well postoperatively.  Discharge home with standard precautions and return to clinic in 1-2 weeks.  CURTIS,CAROL G 10/28/2014, 8:03 AM

## 2014-10-28 NOTE — Discharge Summary (Signed)
Obstetric Discharge Summary Reason for Admission: onset of labor Prenatal Procedures: ultrasound Intrapartum Procedures: cesarean: low cervical, transverse Postpartum Procedures: none Complications-Operative and Postpartum: none HEMOGLOBIN  Date Value Ref Range Status  10/27/2014 8.0* 12.0 - 15.0 g/dL Final  66/29/4765 46.5 12.2 - 16.2 g/dL Final   HCT  Date Value Ref Range Status  10/27/2014 24.9* 36.0 - 46.0 % Final   HCT, POC  Date Value Ref Range Status  11/02/2013 40.2 37.7 - 47.9 % Final    Physical Exam:  General: alert and cooperative Lochia: appropriate Uterine Fundus: firm Incision: healing well DVT Evaluation: No evidence of DVT seen on physical exam. Negative Homan's sign. No cords or calf tenderness. No significant calf/ankle edema.  Discharge Diagnoses: Term Pregnancy-delivered  Discharge Information: Date: 10/28/2014 Activity: pelvic rest Diet: routine Medications: PNV, Ibuprofen, Percocet and prozac Condition: stable Instructions: refer to practice specific booklet Discharge to: home   Newborn Data: Live born female  Birth Weight: 9 lb 4.9 oz (4220 g) APGAR: 9, 9  Home with mother.  CURTIS,CAROL G 10/28/2014, 8:15 AM

## 2014-11-22 ENCOUNTER — Inpatient Hospital Stay (HOSPITAL_COMMUNITY)
Admission: AD | Admit: 2014-11-22 | Discharge: 2014-11-23 | Disposition: A | Discharge status: Home / Self Care | Payer: 59 | Source: Ambulatory Visit | Attending: Obstetrics and Gynecology | Admitting: Obstetrics and Gynecology

## 2014-11-22 DIAGNOSIS — N61 Inflammatory disorders of breast: Secondary | ICD-10-CM | POA: Diagnosis not present

## 2014-11-22 DIAGNOSIS — R509 Fever, unspecified: Secondary | ICD-10-CM | POA: Diagnosis present

## 2014-11-22 DIAGNOSIS — O9122 Nonpurulent mastitis associated with the puerperium: Secondary | ICD-10-CM | POA: Diagnosis not present

## 2014-11-22 LAB — URINE MICROSCOPIC-ADD ON

## 2014-11-22 LAB — URINALYSIS, ROUTINE W REFLEX MICROSCOPIC
Bilirubin Urine: NEGATIVE
Glucose, UA: NEGATIVE mg/dL
KETONES UR: NEGATIVE mg/dL
NITRITE: NEGATIVE
PH: 6 (ref 5.0–8.0)
PROTEIN: NEGATIVE mg/dL
Specific Gravity, Urine: 1.01 (ref 1.005–1.030)
Urobilinogen, UA: 0.2 mg/dL (ref 0.0–1.0)

## 2014-11-22 LAB — CBC
HCT: 31.7 % — ABNORMAL LOW (ref 36.0–46.0)
HEMOGLOBIN: 9.6 g/dL — AB (ref 12.0–15.0)
MCH: 24.9 pg — ABNORMAL LOW (ref 26.0–34.0)
MCHC: 30.3 g/dL (ref 30.0–36.0)
MCV: 82.1 fL (ref 78.0–100.0)
Platelets: 365 10*3/uL (ref 150–400)
RBC: 3.86 MIL/uL — ABNORMAL LOW (ref 3.87–5.11)
RDW: 15.9 % — ABNORMAL HIGH (ref 11.5–15.5)
WBC: 11.1 10*3/uL — AB (ref 4.0–10.5)

## 2014-11-22 NOTE — MAU Note (Signed)
Pt reports she has been achy x 3 days, temp was 101.4 at 2100, took tylenol and rechecked 45 minutes later and it was 102. Is pumping and bottle feeding. Denies abd pain, denies nausea, vomiting or diarrhea. Reports bilateral breast tenderness L>R

## 2014-11-22 NOTE — MAU Provider Note (Signed)
History     CSN: [id number]  Arrival date and time: 11/22/14 2203   None     Chief Complaint  Patient presents with  . Breast Pain  . Fever   HPI  Ms. MANHATTAN MCCUEN is a 24 y.o. G1P1001 here status post of primary LTCS on 10/26/14 for fetal intolerance to labor.  Uncomplicated post-op course.  Here tonight with report of feeling achy x 3 days, temp was 101.4 at 2100, took tylenol and rechecked 45 minutes later and it was 102.  Pt is breast pumping and bottle feeding. Denies abd pain, denies nausea, vomiting or diarrhea. Reports bilateral breast tenderness L>R.  Bleeding is described as scant.  Past Medical History  Diagnosis Date  . Anxiety   . PCOS (polycystic ovarian syndrome)   . OCS syndrome     Past Surgical History  Procedure Laterality Date  . Tonsillitis    . Wisdom tooth extraction    . Tonsillectomy    . Adenoidectomy  1996  . Cesarean section N/A 10/26/2014    Procedure: CESAREAN SECTION;  Surgeon: Richarda Overlie, MD;  Location: [facility];  Service: Obstetrics;  Laterality: N/A;    Family History  Problem Relation Age of Onset  . Crohn's disease Sister   . Cancer Maternal Grandmother     breast  . Heart attack Maternal Grandfather   . Hypertension Maternal Grandfather   . Cancer Paternal Grandmother     melanoma  . Rheum arthritis Paternal Grandfather   . Anemia Mother   . Urolithiasis Father     History  Substance Use Topics  . Smoking status: Never Smoker   . Smokeless tobacco: Never Used  . Alcohol Use: 1.5 oz/week    3 Standard drinks or equivalent per week     Comment: not during pregnancy    Allergies:  Allergies  Allergen Reactions  . Pertussis Vaccines Hives  . Penicillins Itching  . Tape Rash    Prescriptions prior to admission  Medication Sig Dispense Refill Last Dose  . FLUoxetine (PROZAC) 20 MG tablet Take 20 mg by mouth every other day. Alternates between  and  to average    Past Week at Unknown time  . FLUoxetine  (PROZAC) 40 MG capsule Take 40 mg by mouth every other day.   Past Week at Unknown time  . ibuprofen (ADVIL,MOTRIN) 600 MG tablet Take 1 tablet (600 mg total) by mouth every 6 (six) hours as needed for mild pain. 30 tablet 0   . oxyCODONE-acetaminophen (PERCOCET/ROXICET) 5-325 MG per tablet Take 1 tablet by mouth every 4 (four) hours as needed (for pain scale 4-7). 30 tablet 0   . Prenatal Vit-Fe Fumarate-FA (PRENATAL MULTIVITAMIN) TABS tablet Take 1 tablet by mouth daily at 12 noon.   Past Week at Unknown time  . tetrahydrozoline-zinc (VISINE-AC) 0.05-0.25 % ophthalmic solution Place 1-2 drops into both eyes 3 (three) times daily as needed (allergies).    Past Week at Unknown time    Review of Systems  Constitutional: Positive for fever and chills.  Gastrointestinal: Positive for abdominal pain (cramping).  Musculoskeletal: Positive for myalgias.       Breast pain  All other systems reviewed and are negative.  Physical Exam   Blood pressure 95/67, pulse 101, temperature 99.7 F (37.6 C), temperature source Oral, resp. rate 18, height  (1.676 m), weight 83.462 kg (184 lb), unknown if currently breastfeeding.  Physical Exam  Constitutional: She is oriented to person, place, and time. She  appears well-developed and well-nourished.  HENT:  Head: Normocephalic.  Neck: Normal range of motion. Neck supple.  Respiratory:    GI: Soft. There is no tenderness. There is no guarding.  Incision site healing well; well approximated, no signs of infection; no palpable mass.  Genitourinary: There is bleeding (scant) in the vagina.  Neurological: She is alert and oriented to person, place, and time. She has normal reflexes.  Skin: Skin is warm and dry.    MAU Course  Procedures Results for orders placed or performed during the hospital encounter of 11/22/14 (from the past 24 hour(s))  Urinalysis, Routine w reflex microscopic ([facility])     Status: Abnormal   Collection Time: 11/22/14  10:29 PM  Result Value Ref Range   Color, Urine YELLOW YELLOW   APPearance CLEAR CLEAR   Specific Gravity, Urine 1.010 1.005 - 1.030   pH 6.0 5.0 - 8.0   Glucose, UA NEGATIVE NEGATIVE mg/dL   Hgb urine dipstick TRACE (A) NEGATIVE   Bilirubin Urine NEGATIVE NEGATIVE   Ketones, ur NEGATIVE NEGATIVE mg/dL   Protein, ur NEGATIVE NEGATIVE mg/dL   Urobilinogen, UA 0.2 0.0 - 1.0 mg/dL   Nitrite NEGATIVE NEGATIVE   Leukocytes, UA TRACE (A) NEGATIVE  Urine microscopic-add on     Status: None   Collection Time: 11/22/14 10:29 PM  Result Value Ref Range   Squamous Epithelial / LPF RARE RARE   WBC, UA 0-2 <3 WBC/hpf   RBC / HPF 0-2 <3 RBC/hpf   Bacteria, UA RARE RARE  CBC     Status: Abnormal   Collection Time: 11/22/14 11:21 PM  Result Value Ref Range   WBC 11.1 (H) 4.0 - 10.5 K/uL   RBC 3.86 (L) 3.87 - 5.11 MIL/uL   Hemoglobin 9.6 (L) 12.0 - 15.0 g/dL   HCT 19.131.7 (L) 47.836.0 - 29.546.0 %   MCV 82.1 78.0 - 100.0 fL   MCH 24.9 (L) 26.0 - 34.0 pg   MCHC 30.3 30.0 - 36.0 g/dL   RDW 62.115.9 (H) 30.811.5 - 65.715.5 %   Platelets 365 150 - 400 K/uL   0050 Consulted with Dr. Marcelle OverlieHolland > Reviewed HPI/Exam/labs > discharge home with antibiotics Assessment and Plan  Mastitis  Plan: Rest, rest, rest Increase fluids Continue pumping breasts RX Clindamycin 300 mg TID x 10 days Follow-up in no improvement or worsening in 48 hours  KARIM, [facility] Health[facility]ALIDAH N 11/22/2014, 12:53 AM

## 2014-11-23 DIAGNOSIS — N61 Inflammatory disorders of breast: Secondary | ICD-10-CM

## 2014-11-23 MED ORDER — CLINDAMYCIN HCL 300 MG PO CAPS
300.0000 mg | ORAL_CAPSULE | Freq: Once | ORAL | Status: AC
  Administered 2014-11-23: 300 mg via ORAL
  Filled 2014-11-23: qty 1

## 2014-11-23 MED ORDER — CLINDAMYCIN HCL 300 MG PO CAPS: 300.0000 mg | ORAL_CAPSULE | Freq: Three times a day (TID) | ORAL | Status: DC

## 2014-11-23 NOTE — Discharge Instructions (Signed)
Breastfeeding and Mastitis °Mastitis is inflammation of the breast tissue. It can occur in women who are breastfeeding. This can make breastfeeding painful. Mastitis will sometimes go away on its own. Your health care provider will help determine if treatment is needed. °CAUSES °Mastitis is often associated with a blocked milk (lactiferous) duct. This can happen when too much milk builds up in the breast. Causes of excess milk in the breast can include: °· Poor latch-on. If your baby is not latched onto the breast properly, she or he may not empty your breast completely while breastfeeding. °· Allowing too much time to pass between feedings. °· Wearing a bra or other clothing that is too tight. This puts extra pressure on the lactiferous ducts so milk does not flow through them as it should. °Mastitis can also be caused by a bacterial infection. Bacteria may enter the breast tissue through cuts or openings in the skin. In women who are breastfeeding, this may occur because of cracked or irritated skin. Cracks in the skin are often caused when your baby does not latch on properly to the breast. °SIGNS AND SYMPTOMS °· Swelling, redness, tenderness, and pain in an area of the breast. °· Swelling of the glands under the arm on the same side. °· Fever may or may not accompany mastitis. °If an infection is allowed to progress, a collection of pus (abscess) may develop. °DIAGNOSIS  °Your health care provider can usually diagnose mastitis based on your symptoms and a physical exam. Tests may be done to help confirm the diagnosis. These may include: °· Removal of pus from the breast by applying pressure to the area. This pus can be examined in the lab to determine which bacteria are present. If an abscess has developed, the fluid in the abscess can be removed with a needle. This can also be used to confirm the diagnosis and determine the bacteria present. In most cases, pus will not be present. °· Blood tests to determine if  your body is fighting a bacterial infection. °· Mammogram or ultrasound tests to rule out other problems or diseases. °TREATMENT  °Mastitis that occurs with breastfeeding will sometimes go away on its own. Your health care provider may choose to wait 24 hours after first seeing you to decide whether a prescription medicine is needed. If your symptoms are worse after 24 hours, your health care provider will likely prescribe an antibiotic medicine to treat the mastitis. He or she will determine which bacteria are most likely causing the infection and will then select an appropriate antibiotic medicine. This is sometimes changed based on the results of tests performed to identify the bacteria, or if there is no response to the antibiotic medicine selected. Antibiotic medicines are usually given by mouth. You may also be given medicine for pain. °HOME CARE INSTRUCTIONS °· Only take over-the-counter or prescription medicines for pain, fever, or discomfort as directed by your health care provider. °· If your health care provider prescribed an antibiotic medicine, take the medicine as directed. Make sure you finish it even if you start to feel better. °· Do not wear a tight or underwire bra. Wear a soft, supportive bra. °· Increase your fluid intake, especially if you have a fever. °· Continue to empty the breast. Your health care provider can tell you whether this milk is safe for your infant or needs to be thrown out. You may be told to stop nursing until your health care provider thinks it is safe for your baby.   Use a breast pump if you are advised to stop nursing. °· Keep your nipples clean and dry. °· Empty the first breast completely before going to the other breast. If your baby is not emptying your breasts completely for some reason, use a breast pump to empty your breasts. °· If you go back to work, pump your breasts while at work to stay in time with your nursing schedule. °· Avoid allowing your breasts to become  overly filled with milk (engorged). °SEEK MEDICAL CARE IF: °· You have pus-like discharge from the breast. °· Your symptoms do not improve with the treatment prescribed by your health care provider within 2 days. °SEEK IMMEDIATE MEDICAL CARE IF: °· Your pain and swelling are getting worse. °· You have pain that is not controlled with medicine. °· You have a red line extending from the breast toward your armpit. °· You have a fever or persistent symptoms for more than 2-3 days. °· You have a fever and your symptoms suddenly get worse. °MAKE SURE YOU:  °· Understand these instructions. °· Will watch your condition. °· Will get help right away if you are not doing well or get worse. °Document Released: 08/31/2004 Document Revised: 05/11/2013 Document Reviewed: 12/10/2012 °[employer]® Patient Information ©2015 [employer]. This information is not intended to replace advice given to you by your health care provider. Make sure you discuss any questions you have with your health care provider. ° °

## 2014-12-07 ENCOUNTER — Other Ambulatory Visit: Payer: Self-pay | Admitting: Obstetrics and Gynecology

## 2014-12-08 LAB — CYTOLOGY - PAP

## 2016-11-13 ENCOUNTER — Telehealth (HOSPITAL_COMMUNITY): Payer: Self-pay | Admitting: Lactation Services

## 2016-11-13 NOTE — Telephone Encounter (Signed)
Patient has a 334.595 month old son. 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 She had called & left a message b/c she was having some pain & was wanting to be sure that she wasn't getting mastitis again. Patient's call was returned; from her description, the sore area seems as if it is from a bad latch she experienced last night; she has no other s/sxs of mastitis. The patient is interested in bringing in her son for us to observe latch. Glenetta HewKim Aniza Shor, RN, IBCLC

## 2016-11-14 NOTE — Telephone Encounter (Signed)
Note opened in error.

## 2016-11-19 ENCOUNTER — Ambulatory Visit (HOSPITAL_COMMUNITY)
Admission: RE | Admit: 2016-11-19 | Discharge: 2016-11-19 | Disposition: A | Discharge status: Home / Self Care | Payer: 59 | Source: Ambulatory Visit | Attending: Obstetrics and Gynecology | Admitting: Obstetrics and Gynecology

## 2016-11-19 DIAGNOSIS — Z029 Encounter for administrative examinations, unspecified: Secondary | ICD-10-CM | POA: Diagnosis not present

## 2016-11-19 NOTE — Lactation Note (Signed)
Lactation Consult  Mother's reason for visit:  History of recurring mastitis, latch check Visit Type:  Outpatient Appointment Notes:  [name] is now 735 months old. Mom reports that she has had mastitis 8 times. Mom reports she feels over production of breast milk contributing to mastitis. Started antibiotics last week for last episode. Mom has tried several things to decrease over production. She has tried block feedings. Minimizing pumping but pumping as needed when breasts feel over full. Sheryl Wright will not take a bottle from Mom, rarely from Dad. Will sometimes take a bottle from the Grandmothers. If Sheryl Wright would take a bottle Mom ready to dry her milk and use stored EBM/formula and bottle feed.  Consult:  Initial Lactation Consultant:  Alfred LevinsGranger, Ahmar Pickrell Ann  ________________________________________________________________________ Sheryl Wright DOB [birth year] BW  9 lb. 8.0 oz. Initial weight today:  16 lb. 8.8 oz/7508 gm.   ________________________________________________________________________  Mother's Name: Sheryl Wright   Breastfeeding Experience:  P2, Was not successful BF 1st child, too painful to BF. Mom pumped/bottle fed.  ________________________________________________________________________  Breastfeeding History (Post Discharge)  Frequency of breastfeeding: every 2-3 hours during the day. Every 4 hours at night. Duration of feeding:  5-20 minutes  Mom has [employer] Hand Pump. She is pumping 2-3 times per day receiving 5-15 oz. She does pump 1 time at night since Sheryl Wright goes longer between feedings and Mom concerned about breast becoming over full resulting in clogged milk duct/mastitis.   Infant Intake and Output Assessment  Voids:  8 in 24 hrs.  Color:  Clear yellow Stools:  2-3 in 24 hrs.   ________________________________________________________________________  Maternal Breast Assessment  Breast:  Slight fullness. Sheryl BF off left breast, soft after the  feeding. Sheryl did not nurse well on right breast, became distracted so this breast did not soften completely but no nodules palpable on either breast. No redness or tenderness.  Nipple:  Erect Pain level:  0  _______________________________________________________________________ Feeding Assessment/Evaluation  Initial feeding assessment:  Infant's oral assessment:  Variance. 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 Short posterior lingual frenulum noted with restriction of tongue extension, lateralization of tongue.   Positioning:  Cradle Left breast  LATCH documentation:  Latch:  2 = Grasps breast easily, tongue down, lips flanged, rhythmical sucking.  Audible swallowing:  2 = Spontaneous and intermittent  Type of nipple:  2 = Everted at rest and after stimulation  Comfort (Breast/Nipple):  2 = Soft / non-tender  Hold (Positioning):  2 = No assistance needed to correctly position infant at breast             Latch Score:  10  Sheryl did not sustain good depth during the feeding. Upper lip needed to be un-tucked. Pulling at breast, some snap back audible with nursing. Sheryl did transfer milk well however obtaining 62 ml in 5 minutes at breast.  Attempted to latch to right breast. Sheryl fussy and would not stay engaged, became very distracted during the visit. At the end of the visit, Sheryl did re-latch to right breast but came off with Mom's milk ejection. Milk spraying Sheryl when Sheryl came off the breast.  Pre-feed weight:  7508 g  (16 lb. 8.8 oz.) Post-feed weight:  7570 g (16 lb. 11.0 oz.) Amount transferred:  62 ml  Discussed importance of Sheryl sustaining good depth to help Sheryl empty breast well. Although some Mother's do make over abundance of milk. Continue to work with block feedings to slow production.  Minimize pumping unless nodules present.  Discussed with Mom pre-pumping thru 1st milk ejection to slow the flow of milk to see if this helps Sheryl with latch and  sustaining depth at breast which will empty breast better and keep her from needing to post pump. Discussed seeing an oral specialist to evaluate Sheryl for tongue restrictions from frenulum if she decides to continue to BF.  Discussed starting the supplement Lecithin to thin the fat in her milk to prevent clogged milk ducts. The recommended dosage is 1- 2 1200 mg capsules 3-4 times per day  Mom requested information on Sage tea. Advised steep 1 tablespoon of fresh whole leaf dried herb in 1 cup of boiling water for 10-15 minutes. Drink 3-6 cups per day, starting with 3 cups since this can dry her milk. Mom reports she is OK with drying her milk if this happens if Sheryl will take a bottle. Discussed ways to help Sheryl transition to bottle.  In past with mastitis Mom has used cabbage leaves for comfort. Advised cabbage leaves can dry milk as well if this is what she decides to do. Mom to call for further questions/concerns.

## 2016-11-20 ENCOUNTER — Inpatient Hospital Stay (HOSPITAL_COMMUNITY)
Admission: AD | Admit: 2016-11-20 | Discharge: 2016-11-20 | Disposition: A | Discharge status: Home / Self Care | Payer: 59 | Source: Ambulatory Visit | Attending: Obstetrics and Gynecology | Admitting: Obstetrics and Gynecology

## 2016-11-20 ENCOUNTER — Encounter (HOSPITAL_COMMUNITY): Payer: Self-pay | Admitting: *Deleted

## 2016-11-20 DIAGNOSIS — O9122 Nonpurulent mastitis associated with the puerperium: Secondary | ICD-10-CM | POA: Insufficient documentation

## 2016-11-20 DIAGNOSIS — Z880 Allergy status to penicillin: Secondary | ICD-10-CM | POA: Insufficient documentation

## 2016-11-20 DIAGNOSIS — N6019 Diffuse cystic mastopathy of unspecified breast: Secondary | ICD-10-CM | POA: Diagnosis not present

## 2016-11-20 DIAGNOSIS — N644 Mastodynia: Secondary | ICD-10-CM | POA: Diagnosis present

## 2016-11-20 DIAGNOSIS — N61 Mastitis without abscess: Secondary | ICD-10-CM

## 2016-11-20 MED ORDER — SULFAMETHOXAZOLE-TRIMETHOPRIM 800-160 MG PO TABS: 1.0000 | ORAL_TABLET | Freq: Two times a day (BID) | ORAL | 0 refills | Status: AC

## 2016-11-20 MED ORDER — CLINDAMYCIN PHOSPHATE 900 MG/50ML IV SOLN
900.0000 mg | Freq: Once | INTRAVENOUS | Status: AC
  Administered 2016-11-20: 900 mg via INTRAVENOUS
  Filled 2016-11-20: qty 50

## 2016-11-20 MED ORDER — KETOROLAC TROMETHAMINE 60 MG/2ML IM SOLN
60.0000 mg | Freq: Once | INTRAMUSCULAR | Status: AC
  Administered 2016-11-20: 60 mg via INTRAMUSCULAR
  Filled 2016-11-20: qty 2

## 2016-11-20 MED ORDER — LACTATED RINGERS IV BOLUS (SEPSIS)
500.0000 mL | Freq: Once | INTRAVENOUS | Status: AC
  Administered 2016-11-20: 500 mL via INTRAVENOUS

## 2016-11-20 NOTE — Discharge Instructions (Signed)
Mastitis °Mastitis is redness, soreness, and puffiness (inflammation) in an area of the breast. It is often caused by an infection that occurs when bacteria enter the skin. The infection is often helped by antibiotic medicine. °Follow these instructions at home: °· Only take medicines as told by your doctor. °· If your doctor prescribed an antibiotic medicine, take it as told. Finish it even if you start to feel better. °· Do not wear a tight or underwire bra. Wear a soft support bra. °· Drink more fluids, especially if you have a fever. °· If you are breastfeeding: °? Keep emptying the breast. Your doctor can tell you if the milk is safe. Use a breast pump if you are told to stop nursing. °? Keep your nipples clean and dry. °? Empty the first breast before going to the other breast. Use a breast pump if your baby is not emptying your breast. °? If you go back to work, pump your breasts while at work. °? Avoid letting your breasts get overly filled with milk (engorged). °Contact a doctor if: °· You have pus-like fluid leaking from your breast. °· Your symptoms do not get better within 2 days. °Get help right away if: °· Your pain and puffiness are getting worse. °· Your pain is not helped by medicine. °· You have a red line going from your breast toward your armpit. °· You have a fever or lasting symptoms for more than 2-3 days. °· You have a fever and your symptoms suddenly get worse. °This information is not intended to replace advice given to you by your health care provider. Make sure you discuss any questions you have with your health care provider. °Document Released: 04/24/2009 Document Revised: 10/12/2015 Document Reviewed: 12/04/2012 °Elsevier Interactive Patient Education © 2017 [employer] ° °

## 2016-11-20 NOTE — MAU Provider Note (Signed)
History     CSN: [id number]  Arrival date and time: 11/20/16 1704   None     Chief Complaint  Patient presents with  . Breast Pain   HPI Sheryl Wright is 26 y.o. G1P1001 presents with concern for mastitis. She has a hx of recurrent mastitis.  [facility]  Per her note she has had mastitis X 8 since delivering 5 months ago.  Rx'd Cephalexin 500mg  qid last week for infection is still taking for the left breast.  Is allergic to all Med in Penicillin in family. Now having redness on right breast.  Vomiting last night..  She is afebrile today.  She is concerned because the antibiotic is not clearing the right breast sxs and heard about inflammatory breast cancer.  She is a patient of Dr. Dennie BibleHolland's.     Past Medical History:  Diagnosis Date  . Anxiety   . OCS syndrome (HCC)   . PCOS (polycystic ovarian syndrome)     Past Surgical History:  Procedure Laterality Date  . ADENOIDECTOMY  1996  . CESAREAN SECTION N/A 10/26/2014   Procedure: CESAREAN SECTION;  Surgeon: Richarda Overlieichard Holland, MD;  Location: [facility];  Service: Obstetrics;  Laterality: N/A;  . TONSILLECTOMY    . tonsillitis    . WISDOM TOOTH EXTRACTION      Family History  Problem Relation Age of Onset  . Crohn's disease Sister   . Heart attack Maternal Grandfather   . Hypertension Maternal Grandfather   . Anemia Mother   . Urolithiasis Father   . Cancer Maternal Grandmother        breast  . Cancer Paternal Grandmother        melanoma  . Rheum arthritis Paternal Grandfather     Social History  Substance Use Topics  . Smoking status: Never Smoker  . Smokeless tobacco: Never Used  . Alcohol use 1.5 oz/week    3 Standard drinks or equivalent per week     Comment: not during pregnancy    Allergies:  Allergies  Allergen Reactions  . Pertussis Vaccines Hives  . Penicillins Itching  . Tape Rash    Prescriptions Prior to Admission  Medication Sig Dispense Refill Last Dose  .  clindamycin (CLEOCIN) 300 MG capsule Take 1 capsule (300 mg total) by mouth 3 (three) times daily. 30 capsule 0   . FLUoxetine (PROZAC) 20 MG tablet Take 20 mg by mouth every other day. Alternates between 40mg  and 20mg  to average 30mg    Past Week at Unknown time  . FLUoxetine (PROZAC) 40 MG capsule Take 40 mg by mouth every other day.   Past Week at Unknown time  . ibuprofen (ADVIL,MOTRIN) 600 MG tablet Take 1 tablet (600 mg total) by mouth every 6 (six) hours as needed for mild pain. 30 tablet 0   . oxyCODONE-acetaminophen (PERCOCET/ROXICET) 5-325 MG per tablet Take 1 tablet by mouth every 4 (four) hours as needed (for pain scale 4-7). 30 tablet 0   . Prenatal Vit-Fe Fumarate-FA (PRENATAL MULTIVITAMIN) TABS tablet Take 1 tablet by mouth daily at 12 noon.   Past Week at Unknown time  . tetrahydrozoline-zinc (VISINE-AC) 0.05-0.25 % ophthalmic solution Place 1-2 drops into both eyes 3 (three) times daily as needed (allergies).    Past Week at Unknown time    Review of Systems  Constitutional: Negative for chills and fever.  Gastrointestinal: Positive for vomiting (X 1 last night). Negative for abdominal pain.  Skin:  Right breast--tender, red Left breast without tenderness.  Psychiatric/Behavioral:       Anxious   Physical Exam   Blood pressure (!) 102/52, pulse (!) 107, temperature 98 F (36.7 C), temperature source Oral, resp. rate 18, weight 162 lb 1.9 oz (73.5 kg), SpO2 100 %, unknown if currently breastfeeding.  Physical Exam  Nursing note and vitals reviewed. Constitutional: She is oriented to person, place, and time. She appears well-developed and well-nourished. No distress.  HENT:  Head: Normocephalic.  Neck: Normal range of motion.  Cardiovascular: Normal rate.   Respiratory: Effort normal. Right breast exhibits skin change (redness) and tenderness (plum size area of redness, tenderness, warm and firm to touch). Right breast exhibits no inverted nipple, no mass and no  nipple discharge. Left breast exhibits no inverted nipple, no mass, no nipple discharge, no skin change and no tenderness.  GI: Soft. She exhibits no distension and no mass. There is no tenderness. There is no rebound and no guarding.  Neurological: She is alert and oriented to person, place, and time.  Skin: Skin is warm and dry. Erythema: see description of breast under pulmo/chest wall   Psychiatric: She has a normal mood and affect. Her behavior is normal. Thought content normal.  Reports anxiety   MAU Course  Procedures  MDM MSE Exam Consulted with Dr. Vincente Poli.  Orders given for Toradol 60mg  IM, Clindamycin 900 mg IV, begin Bactrim tonight, ice and decompress breast,.  To call office in the am for a follow up breast exam with MD on call for tomorrow.   IV and IM meds given as ordered Assessment and Plan  A;  Recurrent Mastitis  P:  Patient instructed to begin Bactrim DS tonight        Continue to decompress breast with nursing and use ice for comfort      Patient is considering D/C nursing but continue to pump.  Instructions given for this from the lactation consultant yesterday.  Dennison Mascot Key 11/20/2016, 5:23 PM

## 2016-11-20 NOTE — MAU Note (Signed)
Patient has 325 month old. Desires to quit breastfeeding. Seen by lactation yesterday for mastitis. Patient endorses having 8 episodes of mastitis. Was given antibiotic (cephalexin) but feels like she now is having issues with both breast.  Rating pain 8/10 Constant, sharp pain Took ibuprofen 800mg  about 3 hours ago

## 2018-06-06 DIAGNOSIS — J0100 Acute maxillary sinusitis, unspecified: Secondary | ICD-10-CM | POA: Insufficient documentation

## 2018-12-15 DIAGNOSIS — F419 Anxiety disorder, unspecified: Secondary | ICD-10-CM | POA: Insufficient documentation

## 2018-12-15 DIAGNOSIS — Z8742 Personal history of other diseases of the female genital tract: Secondary | ICD-10-CM | POA: Insufficient documentation

## 2019-06-24 ENCOUNTER — Ambulatory Visit
Admission: EM | Admit: 2019-06-24 | Discharge: 2019-06-24 | Disposition: A | Discharge status: Home / Self Care | Payer: 59

## 2019-06-24 ENCOUNTER — Encounter: Payer: Self-pay | Admitting: Emergency Medicine

## 2019-06-24 ENCOUNTER — Other Ambulatory Visit: Payer: Self-pay

## 2019-06-24 DIAGNOSIS — F458 Other somatoform disorders: Secondary | ICD-10-CM

## 2019-06-24 DIAGNOSIS — H9202 Otalgia, left ear: Secondary | ICD-10-CM

## 2019-06-24 MED ORDER — NEOMYCIN-POLYMYXIN-HC 3.5-10000-1 OT SOLN: 3.0000 [drp] | Freq: Four times a day (QID) | OTIC | 0 refills | Status: AC

## 2019-06-24 NOTE — Discharge Instructions (Addendum)
Use night guard. Use nasal spray daily as discussed. Follow up with PCP in 1 week for reevaluation.

## 2019-06-24 NOTE — ED Triage Notes (Addendum)
Pt presents to [facility] for assessment of left ear pain after 2 episodes of going swimming last week.  States hx of clenching jaw as well.   Pt states pain is mostly in the morning on waking, and through out the night if she has been laying on that side, but denies pain at this time.

## 2019-06-24 NOTE — ED Provider Notes (Signed)
[facility]    CSN: [id number] Arrival date & time: 06/24/19  1148      History   Chief Complaint Chief Complaint  Patient presents with  . Otalgia    HPI Sheryl Wright is a 29 y.o. female with history of PCOS, anxiety, and currently [redacted] weeks gestation with twins presenting for left ear pain for the last week.  Patient states she went swimming twice last week at [employer].  Describes aching pain: No discharge, bleeding, trauma to the area.  Has not tried anything for symptoms.  Of note, patient does note that she clenches her teeth at night, worse on left than right side.  Will have left-sided jaw pain as well, feels as though she has been doing this more recently.  No fever, nasal congestion or sinus pressure, dental pain, sore throat, cough.     Past Medical History:  Diagnosis Date  . Anxiety   . OCS syndrome (HCC)   . PCOS (polycystic ovarian syndrome)     Patient Active Problem List   Diagnosis Date Noted  . Pregnancy 10/25/2014  . PCOS (polycystic ovarian syndrome)     Past Surgical History:  Procedure Laterality Date  . ADENOIDECTOMY  1996  . CESAREAN SECTION N/A 10/26/2014   Procedure: CESAREAN SECTION;  Surgeon: Richarda Overlie, MD;  Location: [facility];  Service: Obstetrics;  Laterality: N/A;  . TONSILLECTOMY    . tonsillitis    . WISDOM TOOTH EXTRACTION      OB History    Gravida  2   Para  1   Term  1   Preterm      AB      Living  1     SAB      TAB      Ectopic      Multiple  0   Live Births  1            Home Medications    Prior to Admission medications   Medication Sig Start Date End Date Taking? Authorizing Provider  hydrOXYzine (ATARAX/VISTARIL) 25 MG tablet Take 25 mg by mouth 3 (three) times daily as needed.   Yes [provider]  FLUoxetine (PROZAC) 20 MG tablet Take 20 mg by mouth every other day. Alternates between 40mg  and 20mg  to average 30mg     [provider]  FLUoxetine (PROZAC) 40  MG capsule Take 40 mg by mouth every other day.    [provider]  ibuprofen (ADVIL,MOTRIN) 600 MG tablet Take 1 tablet (600 mg total) by mouth every 6 (six) hours as needed for mild pain. 10/28/14   , NP  neomycin-polymyxin-hydrocortisone (CORTISPORIN) OTIC solution Place 3 drops into both ears 4 (four) times daily for 7 days. 06/24/19 07/01/19  Hall-Potvin, Julio Sicks, PA-C  Prenatal Vit-Fe Fumarate-FA (PRENATAL MULTIVITAMIN) TABS tablet Take 1 tablet by mouth daily at 12 noon.    [provider]  tetrahydrozoline-zinc (VISINE-AC) 0.05-0.25 % ophthalmic solution Place 1-2 drops into both eyes 3 (three) times daily as needed (allergies).     [provider]    Family History Family History  Problem Relation Age of Onset  . Crohn's disease Sister   . Heart attack Maternal Grandfather   . Hypertension Maternal Grandfather   . Anemia Mother   . Urolithiasis Father   . Cancer Maternal Grandmother        breast  . Cancer Paternal Grandmother        melanoma  .  Rheum arthritis Paternal Grandfather     Social History Social History   Tobacco Use  . Smoking status: Never Smoker  . Smokeless tobacco: Never Used  Substance Use Topics  . Alcohol use: Yes    Alcohol/week: 3.0 standard drinks    Types: 3 Standard drinks or equivalent per week    Comment: not during pregnancy  . Drug use: No     Allergies   Pertussis vaccines, Penicillins, Amoxicillin, and Tape   Review of Systems As per HPI   Physical Exam Triage Vital Signs ED Triage Vitals  Enc Vitals Group     BP      Pulse      Resp      Temp      Temp src      SpO2      Weight      Height      Head Circumference      Peak Flow      Pain Score      Pain Loc      Pain Edu?      Excl. in [place]?    No data found.  Updated Vital Signs BP 121/82 (BP Location: Left Arm)   Pulse (!) 111   Temp 98.2 F (36.8 C) (Temporal)   Resp 18   SpO2 95%   Visual Acuity Right Eye  Distance:   Left Eye Distance:   Bilateral Distance:    Right Eye Near:   Left Eye Near:    Bilateral Near:     Physical Exam Constitutional:      General: She is not in acute distress.    Appearance: She is not ill-appearing.  HENT:     Head: Normocephalic and atraumatic.     Jaw: There is normal jaw occlusion. No tenderness or pain on movement.     Right Ear: Hearing, tympanic membrane, ear canal and external ear normal. No tenderness. No mastoid tenderness.     Left Ear: Hearing, tympanic membrane, ear canal and external ear normal. No tenderness. No mastoid tenderness.     Ears:     Comments: Negative tragal tenderness bilaterally    Nose: Nose normal. No nasal deformity, septal deviation or nasal tenderness.     Right Turbinates: Not swollen or pale.     Left Turbinates: Not swollen or pale.     Right Sinus: No maxillary sinus tenderness or frontal sinus tenderness.     Left Sinus: No maxillary sinus tenderness or frontal sinus tenderness.     Mouth/Throat:     Lips: Pink. No lesions.     Mouth: Mucous membranes are moist. No injury.     Pharynx: Oropharynx is clear. Uvula midline. No posterior oropharyngeal erythema or uvula swelling.  Cardiovascular:     Rate and Rhythm: Normal rate.  Pulmonary:     Effort: Pulmonary effort is normal. No respiratory distress.     Breath sounds: No wheezing.  Musculoskeletal:     Cervical back: Normal range of motion and neck supple. No tenderness. No muscular tenderness.  Lymphadenopathy:     Cervical: No cervical adenopathy.  Neurological:     Mental Status: She is alert and oriented to person, place, and time.      UC Treatments / Results  Labs (all labs ordered are listed, but only abnormal results are displayed) Labs Reviewed - No data to display  EKG   Radiology No results found.  Procedures Procedures (including critical care time)  Medications  Ordered in UC Medications - No data to display  Initial Impression  / Assessment and Plan / UC Course  I have reviewed the triage vital signs and the nursing notes.  Pertinent labs & imaging results that were available during my care of the patient were reviewed by me and considered in my medical decision making (see chart for details).     Patient afebrile, nontoxic in office today.  Exam benign, though given pain with water exposure, willing to do topical eardrops at patient's request.  Likely bruxism contributing to symptoms: Patient will buy nightguard, follow-up with PCP for persistent, worsening symptoms over the next week.  Return precautions discussed, patient verbalized understanding and is agreeable to plan. Final Clinical Impressions(s) / UC Diagnoses   Final diagnoses:  Left ear pain  Bruxism (teeth grinding)     Discharge Instructions     Use night guard. Use nasal spray daily as discussed. Follow up with PCP in 1 week for reevaluation.    ED Prescriptions    Medication Sig Dispense Auth. Provider   neomycin-polymyxin-hydrocortisone (CORTISPORIN) OTIC solution Place 3 drops into both ears 4 (four) times daily for 7 days. 10 mL Hall-Potvin, [place], PA-C     PDMP not reviewed this encounter.   Hall-Potvin, [place], [place] 06/24/19 1259

## 2020-04-03 ENCOUNTER — Other Ambulatory Visit: Payer: Self-pay

## 2020-04-03 ENCOUNTER — Ambulatory Visit
Admission: EM | Admit: 2020-04-03 | Discharge: 2020-04-03 | Disposition: A | Discharge status: Home / Self Care | Payer: 59 | Attending: Physician Assistant | Admitting: Physician Assistant

## 2020-04-03 DIAGNOSIS — J0140 Acute pansinusitis, unspecified: Secondary | ICD-10-CM | POA: Diagnosis not present

## 2020-04-03 MED ORDER — FLUCONAZOLE 150 MG PO TABS: 150.0000 mg | ORAL_TABLET | Freq: Every day | ORAL | 0 refills | Status: DC

## 2020-04-03 MED ORDER — CEPHALEXIN 500 MG PO CAPS: 500.0000 mg | ORAL_CAPSULE | Freq: Four times a day (QID) | ORAL | 0 refills | Status: DC

## 2020-04-03 MED ORDER — IPRATROPIUM BROMIDE 0.06 % NA SOLN: 2.0000 | Freq: Four times a day (QID) | NASAL | 0 refills | Status: DC

## 2020-04-03 NOTE — ED Provider Notes (Signed)
[facility]    CSN: [id number] Arrival date & time: 04/03/20  1354      History   Chief Complaint Chief Complaint  Patient presents with  . Nasal Congestion    HPI Sheryl Wright is a 29 y.o. female.   29 year old female comes in for 1.5 week history of URI symptoms. Has had sinus pressure, post nasal drip. Yesterday, started having eye redness with some drainage. Denies fever, cough, shortness of breath. otc cold medicine without relief.      Past Medical History:  Diagnosis Date  . Anxiety   . OCS syndrome (HCC)   . PCOS (polycystic ovarian syndrome)     Patient Active Problem List   Diagnosis Date Noted  . Pregnancy 10/25/2014  . PCOS (polycystic ovarian syndrome)     Past Surgical History:  Procedure Laterality Date  . ADENOIDECTOMY  1996  . CESAREAN SECTION N/A 10/26/2014   Procedure: CESAREAN SECTION;  Surgeon: Richarda Overlie, MD;  Location: [facility];  Service: Obstetrics;  Laterality: N/A;  . TONSILLECTOMY    . tonsillitis    . WISDOM TOOTH EXTRACTION      OB History    Gravida  2   Para  1   Term  1   Preterm      AB      Living  1     SAB      TAB      Ectopic      Multiple  0   Live Births  1            Home Medications    Prior to Admission medications   Medication Sig Start Date End Date Taking? Authorizing Provider  cephALEXin (KEFLEX) 500 MG capsule Take 1 capsule (500 mg total) by mouth 4 (four) times daily. 04/03/20   Cathie Hoops, Dawna Jakes V, PA-C  fluconazole (DIFLUCAN) 150 MG tablet Take 1 tablet (150 mg total) by mouth daily. Take second dose 72 hours later if symptoms still persists. 04/03/20   Cathie Hoops, Naylin Burkle V, PA-C  FLUoxetine (PROZAC) 20 MG tablet Take 20 mg by mouth every other day. Alternates between 40mg  and 20mg  to average 30mg     [provider]  FLUoxetine (PROZAC) 40 MG capsule Take 40 mg by mouth every other day.    [provider]  hydrOXYzine (ATARAX/VISTARIL) 25 MG tablet Take 25 mg by mouth 3  (three) times daily as needed.    [provider]  ibuprofen (ADVIL,MOTRIN) 600 MG tablet Take 1 tablet (600 mg total) by mouth every 6 (six) hours as needed for mild pain. 10/28/14   , NP  ipratropium (ATROVENT) 0.06 % nasal spray Place 2 sprays into both nostrils 4 (four) times daily. 04/03/20   12/28/14, PA-C    Family History Family History  Problem Relation Age of Onset  . Crohn's disease Sister   . Heart attack Maternal Grandfather   . Hypertension Maternal Grandfather   . Anemia Mother   . Urolithiasis Father   . Cancer Maternal Grandmother        breast  . Cancer Paternal Grandmother        melanoma  . Rheum arthritis Paternal Grandfather     Social History Social History   Tobacco Use  . Smoking status: Never Smoker  . Smokeless tobacco: Never Used  Substance Use Topics  . Alcohol use: Yes    Alcohol/week: 3.0 standard drinks    Types: 3 Standard drinks  or equivalent per week    Comment: not during pregnancy  . Drug use: No     Allergies   Pertussis vaccines, Penicillins, Amoxicillin, and Tape   Review of Systems Review of Systems  Reason unable to perform ROS: See HPI as above.     Physical Exam Triage Vital Signs ED Triage Vitals  Enc Vitals Group     BP 04/03/20 1413 116/74     Pulse Rate 04/03/20 1413 85     Resp 04/03/20 1413 19     Temp 04/03/20 1413 98 F (36.7 C)     Temp src --      SpO2 04/03/20 1413 97 %     Weight --      Height --      Head Circumference --      Peak Flow --      Pain Score 04/03/20 1411 0     Pain Loc --      Pain Edu? --      Excl. in GC? --    No data found.  Updated Vital Signs BP 116/74   Pulse 85   Temp 98 F (36.7 C)   Resp 19   LMP 03/20/2020 (Exact Date)   SpO2 97%   Breastfeeding Yes   Physical Exam Constitutional:      General: She is not in acute distress.    Appearance: Normal appearance. She is well-developed. She is not ill-appearing, toxic-appearing or  diaphoretic.  HENT:     Head: Normocephalic and atraumatic.     Right Ear: Tympanic membrane, ear canal and external ear normal. Tympanic membrane is not erythematous or bulging.     Left Ear: Tympanic membrane, ear canal and external ear normal. Tympanic membrane is not erythematous or bulging.     Nose: Congestion present.     Right Sinus: Maxillary sinus tenderness and frontal sinus tenderness present.     Left Sinus: Maxillary sinus tenderness and frontal sinus tenderness present.     Mouth/Throat:     Mouth: Mucous membranes are moist.     Pharynx: Oropharynx is clear. Uvula midline.  Eyes:     Conjunctiva/sclera: Conjunctivae normal.     Pupils: Pupils are equal, round, and reactive to light.  Cardiovascular:     Rate and Rhythm: Normal rate and regular rhythm.  Pulmonary:     Effort: Pulmonary effort is normal. No accessory muscle usage, prolonged expiration, respiratory distress or retractions.     Breath sounds: No decreased air movement or transmitted upper airway sounds. No decreased breath sounds.     Comments: LCTAB Musculoskeletal:     Cervical back: Normal range of motion and neck supple.  Skin:    General: Skin is warm and dry.  Neurological:     Mental Status: She is alert and oriented to person, place, and time.    UC Treatments / Results  Labs (all labs ordered are listed, but only abnormal results are displayed) Labs Reviewed - No data to display  EKG   Radiology No results found.  Procedures Procedures (including critical care time)  Medications Ordered in UC Medications - No data to display  Initial Impression / Assessment and Plan / UC Course  I have reviewed the triage vital signs and the nursing notes.  Pertinent labs & imaging results that were available during my care of the patient were reviewed by me and considered in my medical decision making (see chart for details).    History and  exam worrisome for bacterial sinusitis. Patient with  itching/hives on PCN. Currently breastfeeding 26 month old twins. Discussed with Dr Delton See, will avoid doxycycline for now and trial keflex. Other symptomatic treatment discussed. Return precautions given.  Final Clinical Impressions(s) / UC Diagnoses   Final diagnoses:  Acute non-recurrent pansinusitis   ED Prescriptions    Medication Sig Dispense Auth. Provider   cephALEXin (KEFLEX) 500 MG capsule Take 1 capsule (500 mg total) by mouth 4 (four) times daily. 28 capsule Damiyah Ditmars V, PA-C   fluconazole (DIFLUCAN) 150 MG tablet Take 1 tablet (150 mg total) by mouth daily. Take second dose 72 hours later if symptoms still persists. 2 tablet Nancey Kreitz V, PA-C   ipratropium (ATROVENT) 0.06 % nasal spray Place 2 sprays into both nostrils 4 (four) times daily. 15 mL Belinda Fisher, PA-C     PDMP not reviewed this encounter.   Belinda Fisher, PA-C 04/03/20 1449

## 2020-04-03 NOTE — Discharge Instructions (Signed)
Start keflex as directed. Diflucan to prevent yeast. Atrovent nasal spray as directed. You can use over the counter nasal saline rinse such as neti pot for nasal congestion. Keep hydrated, your urine should be clear to pale yellow in color. Tylenol/motrin for fever and pain. Monitor for any worsening of symptoms, chest pain, shortness of breath, wheezing, swelling of the throat, go to the emergency department for further evaluation needed.

## 2020-04-03 NOTE — ED Triage Notes (Addendum)
Pt presents with complaints of head congestion x 1 and a half weeks ago. Reports yesterday her right eye started getting red with some drainage. Reports post nasal drip. Pt denies any other symptoms. Pt does not wish to be tested for covid until speaking to provider.

## 2020-04-26 ENCOUNTER — Ambulatory Visit
Admission: EM | Admit: 2020-04-26 | Discharge: 2020-04-26 | Disposition: A | Discharge status: Home / Self Care | Payer: 59 | Attending: Emergency Medicine | Admitting: Emergency Medicine

## 2020-04-26 ENCOUNTER — Other Ambulatory Visit: Payer: Self-pay

## 2020-04-26 DIAGNOSIS — J302 Other seasonal allergic rhinitis: Secondary | ICD-10-CM

## 2020-04-26 DIAGNOSIS — J029 Acute pharyngitis, unspecified: Secondary | ICD-10-CM | POA: Diagnosis not present

## 2020-04-26 MED ORDER — BENZONATATE 100 MG PO CAPS: 100.0000 mg | ORAL_CAPSULE | Freq: Three times a day (TID) | ORAL | 0 refills | Status: DC

## 2020-04-26 MED ORDER — FLUTICASONE PROPIONATE 50 MCG/ACT NA SUSP: 1.0000 | Freq: Every day | NASAL | 0 refills | Status: AC

## 2020-04-26 MED ORDER — SUCRALFATE 1 GM/10ML PO SUSP: 1.0000 g | Freq: Three times a day (TID) | ORAL | 0 refills | Status: AC

## 2020-04-26 NOTE — ED Provider Notes (Signed)
[facility]    CSN: [id number] Arrival date & time: 04/26/20  0831      History   Chief Complaint Chief Complaint  Patient presents with  . Sore Throat    since sunday    HPI Sheryl Wright is a 29 y.o. female  Presenting for sore throat since this weekend.  States that she was outside putting Christmas lights up, and has history of seasonal allergies.  Took an allergy tablet yesterday: Has not used nasal spray.  Denies fever, shortness of breath.  Did have cough prior to this that was dry, nonproductive.  Past Medical History:  Diagnosis Date  . Anxiety   . OCS syndrome (HCC)   . PCOS (polycystic ovarian syndrome)     Patient Active Problem List   Diagnosis Date Noted  . Pregnancy 10/25/2014  . PCOS (polycystic ovarian syndrome)     Past Surgical History:  Procedure Laterality Date  . ADENOIDECTOMY  1996  . CESAREAN SECTION N/A 10/26/2014   Procedure: CESAREAN SECTION;  Surgeon: Richarda Overlie, MD;  Location: [facility];  Service: Obstetrics;  Laterality: N/A;  . TONSILLECTOMY    . tonsillitis    . WISDOM TOOTH EXTRACTION      OB History    Gravida  2   Para  1   Term  1   Preterm      AB      Living  1     SAB      TAB      Ectopic      Multiple  0   Live Births  1            Home Medications    Prior to Admission medications   Medication Sig Start Date End Date Taking? Authorizing Provider  benzonatate (TESSALON) 100 MG capsule Take 1 capsule (100 mg total) by mouth every 8 (eight) hours. 04/26/20   Hall-Potvin, [place], PA-C  FLUoxetine (PROZAC) 20 MG tablet Take 20 mg by mouth every other day. Alternates between 40mg  and 20mg  to average 30mg     [provider]  FLUoxetine (PROZAC) 40 MG capsule Take 40 mg by mouth every other day.    [provider]  fluticasone (FLONASE) 50 MCG/ACT nasal spray Place 1 spray into both nostrils daily. 04/26/20   Hall-Potvin, , PA-C  hydrOXYzine (ATARAX/VISTARIL) 25  MG tablet Take 25 mg by mouth 3 (three) times daily as needed.    [provider]  ibuprofen (ADVIL,MOTRIN) 600 MG tablet Take 1 tablet (600 mg total) by mouth every 6 (six) hours as needed for mild pain. 10/28/14   14/8/21, NP  ipratropium (ATROVENT) 0.06 % nasal spray Place 2 sprays into both nostrils 4 (four) times daily. 04/03/20   12/28/14, Amy V, PA-C  sucralfate (CARAFATE) 1 GM/10ML suspension Take 10 mLs (1 g total) by mouth 4 (four) times daily -  with meals and at bedtime. 04/26/20   Hall-Potvin, 04/05/20, PA-C    Family History Family History  Problem Relation Age of Onset  . Crohn's disease Sister   . Heart attack Maternal Grandfather   . Hypertension Maternal Grandfather   . Anemia Mother   . Urolithiasis Father   . Cancer Maternal Grandmother        breast  . Cancer Paternal Grandmother        melanoma  . Rheum arthritis Paternal Grandfather     Social History Social History   Tobacco Use  . Smoking status: Never  Smoker  . Smokeless tobacco: Never Used  Vaping Use  . Vaping Use: Never used  Substance Use Topics  . Alcohol use: Yes    Alcohol/week: 3.0 standard drinks    Types: 3 Standard drinks or equivalent per week    Comment: not during pregnancy  . Drug use: No     Allergies   Pertussis vaccines, Penicillins, Amoxicillin, and Tape   Review of Systems Review of Systems  Constitutional: Positive for fatigue. Negative for fever.  HENT: Positive for congestion, postnasal drip, rhinorrhea and sore throat. Negative for dental problem, ear pain, facial swelling, hearing loss, sinus pain, trouble swallowing and voice change.   Eyes: Negative for photophobia, pain and visual disturbance.  Respiratory: Positive for cough. Negative for shortness of breath and wheezing.   Cardiovascular: Negative for chest pain and palpitations.  Gastrointestinal: Negative for diarrhea and vomiting.  Musculoskeletal: Negative for arthralgias and myalgias.  Neurological:  Negative for dizziness and headaches.     Physical Exam Triage Vital Signs ED Triage Vitals [04/26/20 0915]  Enc Vitals Group     BP 109/69     Pulse Rate 86     Resp 20     Temp 98.1 F (36.7 C)     Temp Source Oral     SpO2 97 %     Weight      Height      Head Circumference      Peak Flow      Pain Score      Pain Loc      Pain Edu?      Excl. in GC?    No data found.  Updated Vital Signs BP 109/69 (BP Location: Left Arm)   Pulse 86   Temp 98.1 F (36.7 C) (Oral)   Resp 20   LMP 04/12/2020 (Approximate)   SpO2 97%   Visual Acuity Right Eye Distance:   Left Eye Distance:   Bilateral Distance:    Right Eye Near:   Left Eye Near:    Bilateral Near:     Physical Exam Constitutional:      General: She is not in acute distress. HENT:     Head: Normocephalic and atraumatic.     Jaw: There is normal jaw occlusion. No tenderness or pain on movement.     Right Ear: Hearing, tympanic membrane, ear canal and external ear normal. No tenderness. No mastoid tenderness.     Left Ear: Hearing, tympanic membrane, ear canal and external ear normal. No tenderness. No mastoid tenderness.     Nose: No nasal deformity, septal deviation or nasal tenderness.     Right Turbinates: Not swollen or pale.     Left Turbinates: Not swollen or pale.     Right Sinus: No maxillary sinus tenderness or frontal sinus tenderness.     Left Sinus: No maxillary sinus tenderness or frontal sinus tenderness.     Mouth/Throat:     Lips: Pink. No lesions.     Mouth: Mucous membranes are moist. No injury.     Pharynx: Oropharynx is clear. Uvula midline. Posterior oropharyngeal erythema present. No uvula swelling.     Comments: no tonsillar exudate or hypertrophy Eyes:     Conjunctiva/sclera: Conjunctivae normal.     Pupils: Pupils are equal, round, and reactive to light.  Cardiovascular:     Rate and Rhythm: Normal rate and regular rhythm.  Pulmonary:     Effort: Pulmonary effort is normal.  No respiratory distress.  Breath sounds: No wheezing.  Musculoskeletal:     Cervical back: Normal range of motion and neck supple. No muscular tenderness.  Lymphadenopathy:     Cervical: No cervical adenopathy.  Skin:    Capillary Refill: Capillary refill takes less than 2 seconds.  Neurological:     General: No focal deficit present.     Mental Status: She is alert and oriented to person, place, and time.      UC Treatments / Results  Labs (all labs ordered are listed, but only abnormal results are displayed) Labs Reviewed - No data to display  EKG   Radiology No results found.  Procedures Procedures (including critical care time)  Medications Ordered in UC Medications - No data to display  Initial Impression / Assessment and Plan / UC Course  I have reviewed the triage vital signs and the nursing notes.  Pertinent labs & imaging results that were available during my care of the patient were reviewed by me and considered in my medical decision making (see chart for details).     Afebrile, nontoxic in office today.  Likely allergic rhinitis given H&P.  Patient declined Covid testing at this time (no known contacts) though will consider if symptoms worsen over the next few days.  Will treat supportively as below.  Return precautions discussed, pt verbalized understanding and is agreeable to plan. Final Clinical Impressions(s) / UC Diagnoses   Final diagnoses:  Seasonal allergic rhinitis, unspecified trigger  Sore throat     Discharge Instructions     Tessalon for cough. Start flonase, atrovent nasal spray for nasal congestion/drainage. You can use over the counter nasal saline rinse such as neti pot for nasal congestion. Keep hydrated, your urine should be clear to pale yellow in color. Tylenol/motrin for fever and pain. Monitor for any worsening of symptoms, chest pain, shortness of breath, wheezing, swelling of the throat, go to the emergency department for  further evaluation needed.     ED Prescriptions    Medication Sig Dispense Auth. Provider   sucralfate (CARAFATE) 1 GM/10ML suspension Take 10 mLs (1 g total) by mouth 4 (four) times daily -  with meals and at bedtime. 420 mL Hall-Potvin, [place], PA-C   fluticasone (FLONASE) 50 MCG/ACT nasal spray Place 1 spray into both nostrils daily. 16 g Hall-Potvin, [place], PA-C   benzonatate (TESSALON) 100 MG capsule Take 1 capsule (100 mg total) by mouth every 8 (eight) hours. 21 capsule Hall-Potvin, [place], PA-C     PDMP not reviewed this encounter.   Hall-Potvin, [place], [place] 04/26/20 1010

## 2020-04-26 NOTE — Discharge Instructions (Addendum)

## 2020-04-26 NOTE — ED Triage Notes (Signed)
Pt states she has had a sore throat since this weekend after being outside putting up lights. Pt is aox4 and ambulatory.

## 2020-06-09 ENCOUNTER — Other Ambulatory Visit: Payer: Self-pay

## 2020-06-09 ENCOUNTER — Ambulatory Visit
Admission: RE | Admit: 2020-06-09 | Discharge: 2020-06-09 | Disposition: A | Discharge status: Home / Self Care | Payer: 59 | Source: Ambulatory Visit | Attending: Family Medicine | Admitting: Family Medicine

## 2020-06-09 VITALS — BP 118/71 | HR 84 | Temp 98.7°F | Resp 18 | Ht 66.0 in | Wt 162.0 lb

## 2020-06-09 DIAGNOSIS — B373 Candidiasis of vulva and vagina: Secondary | ICD-10-CM

## 2020-06-09 DIAGNOSIS — B3731 Acute candidiasis of vulva and vagina: Secondary | ICD-10-CM

## 2020-06-09 DIAGNOSIS — R1030 Lower abdominal pain, unspecified: Secondary | ICD-10-CM

## 2020-06-09 LAB — URINALYSIS, COMPLETE (UACMP) WITH MICROSCOPIC
Bacteria, UA: NONE SEEN
Bilirubin Urine: NEGATIVE
Glucose, UA: NEGATIVE mg/dL
Hgb urine dipstick: NEGATIVE
Ketones, ur: NEGATIVE mg/dL
Leukocytes,Ua: NEGATIVE
Nitrite: NEGATIVE
Protein, ur: NEGATIVE mg/dL
RBC / HPF: NONE SEEN RBC/hpf (ref 0–5)
Specific Gravity, Urine: 1.025 (ref 1.005–1.030)
WBC, UA: NONE SEEN WBC/hpf (ref 0–5)
pH: 5.5 (ref 5.0–8.0)

## 2020-06-09 LAB — BASIC METABOLIC PANEL
Anion gap: 13 (ref 5–15)
BUN: 14 mg/dL (ref 6–20)
CO2: 22 mmol/L (ref 22–32)
Calcium: 9.5 mg/dL (ref 8.9–10.3)
Chloride: 102 mmol/L (ref 98–111)
Creatinine, Ser: 0.61 mg/dL (ref 0.44–1.00)
GFR, Estimated: >60 mL/min (ref >60)
Glucose, Bld: 100 mg/dL — ABNORMAL HIGH (ref 70–99)
Potassium: 4.1 mmol/L (ref 3.5–5.1)
Sodium: 137 mmol/L (ref 135–145)

## 2020-06-09 LAB — WET PREP, GENITAL
Clue Cells Wet Prep HPF POC: NONE SEEN
Sperm: NONE SEEN
Trich, Wet Prep: NONE SEEN

## 2020-06-09 LAB — CBC WITH DIFFERENTIAL/PLATELET
Abs Immature Granulocytes: 0.05 10*3/uL (ref 0.00–0.07)
Basophils Absolute: 0 10*3/uL (ref 0.0–0.1)
Basophils Relative: 0 %
Eosinophils Absolute: 0.1 10*3/uL (ref 0.0–0.5)
Eosinophils Relative: 2 %
HCT: 37 % (ref 36.0–46.0)
Hemoglobin: 12.2 g/dL (ref 12.0–15.0)
Immature Granulocytes: 1 %
Lymphocytes Relative: 44 %
Lymphs Abs: 3.3 10*3/uL (ref 0.7–4.0)
MCH: 29.3 pg (ref 26.0–34.0)
MCHC: 33 g/dL (ref 30.0–36.0)
MCV: 88.9 fL (ref 80.0–100.0)
Monocytes Absolute: 0.7 10*3/uL (ref 0.1–1.0)
Monocytes Relative: 10 %
Neutro Abs: 3.2 10*3/uL (ref 1.7–7.7)
Neutrophils Relative %: 43 %
Platelets: 303 10*3/uL (ref 150–400)
RBC: 4.16 MIL/uL (ref 3.87–5.11)
RDW: 13 % (ref 11.5–15.5)
WBC: 7.5 10*3/uL (ref 4.0–10.5)
nRBC: 0 % (ref 0.0–0.2)

## 2020-06-09 LAB — PREGNANCY, URINE: Preg Test, Ur: NEGATIVE

## 2020-06-09 MED ORDER — FLUCONAZOLE 150 MG PO TABS: ORAL_TABLET | ORAL | 0 refills | Status: DC

## 2020-06-09 MED ORDER — KETOROLAC TROMETHAMINE 10 MG PO TABS: 10.0000 mg | ORAL_TABLET | Freq: Four times a day (QID) | ORAL | 0 refills | Status: AC | PRN

## 2020-06-09 NOTE — ED Provider Notes (Signed)
[facility]    CSN: [id number] Arrival date & time: 06/09/20  1525      History   Chief Complaint Chief Complaint  Patient presents with   Abdominal Pain    RLQ   Back Pain    HPI Sheryl MCPHEE is a 30 y.o. female presenting for right sided lower abdominal pain with radiation to the right lower back x 5 days.  She also states that she will have intermittent pain on the left lower abdomen and all the way across the back as well, but pain continues to be worse at the right lower side.  Admits to some vaginal discharge and states that she has had some dark vaginal blood off and on for the past couple months.  Patient states that she had twins in February 2021 and did not have a normal menstrual period until October or November 2021.  Patient states that she is still not having periods every month.  Last menstrual period was at the end of December 2021.  Patient denies any concern for STIs.  She does have history of PCOS and states that she has had pain related to PCOS in the past.  Patient states she is going out of town soon and wants to make sure she does not have appendicitis.  Patient denies fever, appetite changes, nausea/vomiting/diarrhea or constipation.  Denies any pain with urination, urinary frequency or urgency.  Patient has taken ibuprofen for pain with some relief.  No other concerns today.  HPI  Past Medical History:  Diagnosis Date   Anxiety    OCS syndrome (HCC)    PCOS (polycystic ovarian syndrome)     Patient Active Problem List   Diagnosis Date Noted   Pregnancy 10/25/2014   PCOS (polycystic ovarian syndrome)     Past Surgical History:  Procedure Laterality Date   ADENOIDECTOMY  1996   CESAREAN SECTION N/A 10/26/2014   Procedure: CESAREAN SECTION;  Surgeon: Richarda Overlie, MD;  Location: [facility];  Service: Obstetrics;  Laterality: N/A;   TONSILLECTOMY     tonsillitis     WISDOM TOOTH EXTRACTION      OB History    Gravida  2   Para   1   Term  1   Preterm      AB      Living  1     SAB      IAB      Ectopic      Multiple  0   Live Births  1            Home Medications    Prior to Admission medications   Medication Sig Start Date End Date Taking? Authorizing Provider  fluconazole (DIFLUCAN) 150 MG tablet Take 1 tab PO today and repeat in 72h 06/09/20  Yes Eusebio Friendly B, PA-C  FLUoxetine (PROZAC) 20 MG tablet Take 20 mg by mouth every other day. Alternates between 40mg  and 20mg  to average 30mg    Yes [provider]  FLUoxetine (PROZAC) 40 MG capsule Take 40 mg by mouth every other day.   Yes [provider]  hydrOXYzine (ATARAX/VISTARIL) 25 MG tablet Take 25 mg by mouth 3 (three) times daily as needed.   Yes [provider]  ketorolac (TORADOL) 10 MG tablet Take 1 tablet (10 mg total) by mouth every 6 (six) hours as needed for up to 5 days for moderate pain or severe pain. 06/09/20 06/14/20 Yes , PA-C  benzonatate (TESSALON)  100 MG capsule Take 1 capsule (100 mg total) by mouth every 8 (eight) hours. 04/26/20   Hall-Potvin, [place], PA-C  fluticasone (FLONASE) 50 MCG/ACT nasal spray Place 1 spray into both nostrils daily. 04/26/20   Hall-Potvin, [place], PA-C  ipratropium (ATROVENT) 0.06 % nasal spray Place 2 sprays into both nostrils 4 (four) times daily. 04/03/20   Cathie Hoops, Amy V, PA-C  sucralfate (CARAFATE) 1 GM/10ML suspension Take 10 mLs (1 g total) by mouth 4 (four) times daily -  with meals and at bedtime. 04/26/20   Hall-Potvin, [place], PA-C    Family History Family History  Problem Relation Age of Onset   Crohn's disease Sister    Heart attack Maternal Grandfather    Hypertension Maternal Grandfather    Anemia Mother    Urolithiasis Father    Cancer Maternal Grandmother        breast   Cancer Paternal Grandmother        melanoma   Rheum arthritis Paternal Grandfather     Social History Social History   Tobacco Use   Smoking status:  Never Smoker   Smokeless tobacco: Never Used  Building services engineer Use: Never used  Substance Use Topics   Alcohol use: Yes    Alcohol/week: 3.0 standard drinks    Types: 3 Standard drinks or equivalent per week    Comment: not during pregnancy   Drug use: No     Allergies   Pertussis vaccines, Penicillins, Amoxicillin, and Tape   Review of Systems Review of Systems  Constitutional: Negative for appetite change, fatigue and fever.  Cardiovascular: Negative for chest pain.  Gastrointestinal: Positive for abdominal pain. Negative for diarrhea, nausea and vomiting.  Genitourinary: Positive for vaginal discharge. Negative for dysuria, flank pain, frequency, hematuria, urgency, vaginal bleeding and vaginal pain.  Musculoskeletal: Positive for back pain.  Skin: Negative for rash.  Neurological: Negative for weakness.     Physical Exam Triage Vital Signs ED Triage Vitals  Enc Vitals Group     BP 06/09/20 1651 118/71     Pulse Rate 06/09/20 1651 84     Resp 06/09/20 1651 18     Temp 06/09/20 1651 98.7 F (37.1 C)     Temp Source 06/09/20 1651 Oral     SpO2 06/09/20 1651 100 %     Weight 06/09/20 1649 162 lb 0.6 oz (73.5 kg)     Height 06/09/20 1649 5\' 6"  (1.676 m)     Head Circumference --      Peak Flow --      Pain Score 06/09/20 1648 8     Pain Loc --      Pain Edu? --      Excl. in GC? --    No data found.  Updated Vital Signs BP 118/71 (BP Location: Right Arm)    Pulse 84    Temp 98.7 F (37.1 C) (Oral)    Resp 18    Ht 5\' 6"  (1.676 m)    Wt 162 lb 0.6 oz (73.5 kg)    LMP 05/12/2020 (Approximate)    SpO2 100%    Breastfeeding No    BMI 26.15 kg/m       Physical Exam Vitals and nursing note reviewed.  Constitutional:      General: She is not in acute distress.    Appearance: Normal appearance. She is well-developed. She is not ill-appearing or toxic-appearing.  HENT:     Head: Normocephalic and atraumatic.  Eyes:  General: No scleral icterus.        Right eye: No discharge.        Left eye: No discharge.     Conjunctiva/sclera: Conjunctivae normal.  Cardiovascular:     Rate and Rhythm: Normal rate and regular rhythm.     Heart sounds: Normal heart sounds.  Pulmonary:     Effort: Pulmonary effort is normal. No respiratory distress.     Breath sounds: Normal breath sounds.  Abdominal:     General: Bowel sounds are normal.     Palpations: Abdomen is soft.     Tenderness: There is abdominal tenderness in the right lower quadrant. There is no right CVA tenderness, left CVA tenderness, guarding or rebound.  Musculoskeletal:     Cervical back: Neck supple.  Skin:    General: Skin is dry.  Neurological:     General: No focal deficit present.     Mental Status: She is alert. Mental status is at baseline.     Motor: No weakness.     Gait: Gait normal.  Psychiatric:        Mood and Affect: Mood normal.        Behavior: Behavior normal.        Thought Content: Thought content normal.      UC Treatments / Results  Labs (all labs ordered are listed, but only abnormal results are displayed) Labs Reviewed  WET PREP, GENITAL - Abnormal; Notable for the following components:      Result Value   Yeast Wet Prep HPF POC PRESENT (*)    WBC, Wet Prep HPF POC RARE (*)    All other components within normal limits  BASIC METABOLIC PANEL - Abnormal; Notable for the following components:   Glucose, Bld 100 (*)    All other components within normal limits  URINALYSIS, COMPLETE (UACMP) WITH MICROSCOPIC  PREGNANCY, URINE  CBC WITH DIFFERENTIAL/PLATELET    EKG   Radiology No results found.  Procedures Procedures (including critical care time)  Medications Ordered in UC Medications - No data to display  Initial Impression / Assessment and Plan / UC Course  I have reviewed the triage vital signs and the nursing notes.  Pertinent labs & imaging results that were available during my care of the patient were reviewed by me and  considered in my medical decision making (see chart for details).   30 year old female with lower abdominal pain and radiation to the back x4 to 5 days.  In the clinic, patient is in no acute distress and all vital signs are normal and stable.  On exam she does have tenderness to the right lower quadrant.  Urinalysis performed today and it is within normal limits.  A pregnancy test was performed as well which was negative.  Patient states that she forgot to mention that she has had her "tubes tied."  Wet prep is positive for yeast.  CBC and BMP performed which are both normal.  Advised patient no concern for acute appendicitis or other serious abdominal condition since her vitals look normal and her labs are also reassuring.  Patient is reassured by this.  Advised her that her abdominal cramping is likely due to yeast infection and premenstrual type symptoms since she is due to be starting her menstrual period in the next couple of days.  Advised supportive care with increasing rest and fluids.  Sent Toradol for the pain.  Sent Diflucan for the yeast infection.  Reviewed ED precautions with patient.  Final Clinical Impressions(s) / UC Diagnoses   Final diagnoses:  Lower abdominal pain  Vaginal yeast infection     Discharge Instructions     A vaginal swab shows that you have a yeast infection.  Your urine test is normal.  We performed a CBC and basic metabolic panel and those were normal.  Your exam, vital signs, and labs are not concerning for acute appendicitis or other emergent abdominal condition.  I have sent Diflucan for the yeast infection and ketorolac for pain.  If you also need to take Tylenol for the pain you can.  I suspect that you are probably having the discomfort due to the yeast infection and also possibly because you are having premenstrual type symptoms.  If the abdominal pain gets worse or is associated with fever, increased vaginal discharge, severe worsening back pain,  nausea/vomiting, dark stools or blood in the stool, call EMS or go to ED.    ED Prescriptions    Medication Sig Dispense Auth. Provider   fluconazole (DIFLUCAN) 150 MG tablet Take 1 tab PO today and repeat in 72h 2 tablet Eusebio Friendly B, PA-C   ketorolac (TORADOL) 10 MG tablet Take 1 tablet (10 mg total) by mouth every 6 (six) hours as needed for up to 5 days for moderate pain or severe pain. 20 tablet Gareth Morgan     PDMP not reviewed this encounter.   Shirlee Latch, PA-C 06/09/20 1807

## 2020-06-09 NOTE — ED Triage Notes (Signed)
Pt c/o RLQ pain that radiates to her back. Started about 4-5 days ago. She states she has had an ovarian cyst before and feels similar. LMP 05/12/20

## 2020-06-09 NOTE — Discharge Instructions (Signed)
A vaginal swab shows that you have a yeast infection.  Your urine test is normal.  We performed a CBC and basic metabolic panel and those were normal.  Your exam, vital signs, and labs are not concerning for acute appendicitis or other emergent abdominal condition.  I have sent Diflucan for the yeast infection and ketorolac for pain.  If you also need to take Tylenol for the pain you can.  I suspect that you are probably having the discomfort due to the yeast infection and also possibly because you are having premenstrual type symptoms.  If the abdominal pain gets worse or is associated with fever, increased vaginal discharge, severe worsening back pain, nausea/vomiting, dark stools or blood in the stool, call EMS or go to ED.

## 2020-07-12 ENCOUNTER — Ambulatory Visit: Payer: Self-pay

## 2020-09-21 ENCOUNTER — Ambulatory Visit
Admission: EM | Admit: 2020-09-21 | Discharge: 2020-09-21 | Disposition: A | Discharge status: Home / Self Care | Payer: 59 | Attending: Family Medicine | Admitting: Family Medicine

## 2020-09-21 ENCOUNTER — Other Ambulatory Visit: Payer: Self-pay

## 2020-09-21 ENCOUNTER — Encounter: Payer: Self-pay | Admitting: Emergency Medicine

## 2020-09-21 DIAGNOSIS — J069 Acute upper respiratory infection, unspecified: Secondary | ICD-10-CM

## 2020-09-21 LAB — POCT RAPID STREP A (OFFICE): Rapid Strep A Screen: NEGATIVE

## 2020-09-21 MED ORDER — PSEUDOEPH-BROMPHEN-DM 30-2-10 MG/5ML PO SYRP: 5.0000 mL | ORAL_SOLUTION | Freq: Four times a day (QID) | ORAL | 0 refills | Status: DC | PRN

## 2020-09-21 MED ORDER — IPRATROPIUM BROMIDE 0.03 % NA SOLN: 2.0000 | Freq: Three times a day (TID) | NASAL | 0 refills | Status: AC | PRN

## 2020-09-21 NOTE — ED Provider Notes (Signed)
[facility]    CSN: [id number] Arrival date & time: 09/21/20  0806      History   Chief Complaint Chief Complaint  Patient presents with  . Sore Throat  . Cough    HPI Sheryl Wright is a 30 y.o. female.   HPI  Patient presents with URI symptoms of sore throat, cough, nasal congestion, and she has some generalized neck pain.  She has been afebrile.  She endorses some hot and cold sensation.  She also endorses some intermittent dizziness.  She is taken allergy medicines x1 day.   Past Medical History:  Diagnosis Date  . Anxiety   . OCS syndrome (HCC)   . PCOS (polycystic ovarian syndrome)     Patient Active Problem List   Diagnosis Date Noted  . Pregnancy 10/25/2014  . PCOS (polycystic ovarian syndrome)     Past Surgical History:  Procedure Laterality Date  . ADENOIDECTOMY  1996  . CESAREAN SECTION N/A 10/26/2014   Procedure: CESAREAN SECTION;  Surgeon: Richarda Overlie, MD;  Location: [facility];  Service: Obstetrics;  Laterality: N/A;  . TONSILLECTOMY    . tonsillitis    . WISDOM TOOTH EXTRACTION      OB History    Gravida  2   Para  1   Term  1   Preterm      AB      Living  1     SAB      IAB      Ectopic      Multiple  0   Live Births  1            Home Medications    Prior to Admission medications   Medication Sig Start Date End Date Taking? Authorizing Provider  benzonatate (TESSALON) 100 MG capsule Take 1 capsule (100 mg total) by mouth every 8 (eight) hours. 04/26/20   Hall-Potvin, [place], PA-C  fluconazole (DIFLUCAN) 150 MG tablet Take 1 tab PO today and repeat in 72h 06/09/20   Eusebio Friendly B, PA-C  FLUoxetine (PROZAC) 20 MG tablet Take 20 mg by mouth every other day. Alternates between 40mg  and 20mg  to average 30mg     [provider]  FLUoxetine (PROZAC) 40 MG capsule Take 40 mg by mouth every other day.    [provider]  fluticasone (FLONASE) 50 MCG/ACT nasal spray Place 1 spray into both nostrils  daily. 04/26/20   Hall-Potvin, , PA-C  hydrOXYzine (ATARAX/VISTARIL) 25 MG tablet Take 25 mg by mouth 3 (three) times daily as needed.    [provider]  ipratropium (ATROVENT) 0.06 % nasal spray Place 2 sprays into both nostrils 4 (four) times daily. 04/03/20   14/8/21, Amy V, PA-C  sucralfate (CARAFATE) 1 GM/10ML suspension Take 10 mLs (1 g total) by mouth 4 (four) times daily -  with meals and at bedtime. 04/26/20   Hall-Potvin, 04/05/20, PA-C    Family History Family History  Problem Relation Age of Onset  . Crohn's disease Sister   . Heart attack Maternal Grandfather   . Hypertension Maternal Grandfather   . Anemia Mother   . Urolithiasis Father   . Cancer Maternal Grandmother        breast  . Cancer Paternal Grandmother        melanoma  . Rheum arthritis Paternal Grandfather     Social History Social History   Tobacco Use  . Smoking status: Never Smoker  . Smokeless tobacco: Never Used  Vaping Use  .  Vaping Use: Never used  Substance Use Topics  . Alcohol use: Yes    Alcohol/week: 3.0 standard drinks    Types: 3 Standard drinks or equivalent per week    Comment: not during pregnancy  . Drug use: No     Allergies   Pertussis vaccines, Penicillins, Amoxicillin, and Tape   Review of Systems Review of Systems Pertinent negatives listed in HPI  Physical Exam Triage Vital Signs ED Triage Vitals  Enc Vitals Group     BP 09/21/20 0817 114/71     Pulse Rate 09/21/20 0817 (!) 102     Resp 09/21/20 0817 17     Temp 09/21/20 0817 97.9 F (36.6 C)     Temp Source 09/21/20 0817 Oral     SpO2 09/21/20 0817 97 %     Weight --      Height --      Head Circumference --      Peak Flow --      Pain Score 09/21/20 0822 6     Pain Loc --      Pain Edu? --      Excl. in GC? --    No data found.  Updated Vital Signs BP 114/71 (BP Location: Left Arm)   Pulse (!) 102   Temp 97.9 F (36.6 C) (Oral)   Resp 17   SpO2 97%   Visual Acuity Right Eye  Distance:   Left Eye Distance:   Bilateral Distance:    Right Eye Near:   Left Eye Near:    Bilateral Near:     Physical Exam  General Appearance:    Alert, cooperative, no distress  HENT:   Normocephalic, ears normal, nares mucosal edema with congestion, rhinorrhea, oropharynx clear   Eyes:    PERRL, conjunctiva/corneas clear, EOM's intact       Lungs:     Clear to auscultation bilaterally, respirations unlabored  Heart:    Regular rate and rhythm  Neurologic:   Awake, alert, oriented x 3. No apparent focal neurological           defect.       UC Treatments / Results  Labs (all labs ordered are listed, but only abnormal results are displayed) Labs Reviewed - No data to display  EKG   Radiology No results found.  Procedures Procedures (including critical care time)  Medications Ordered in UC Medications - No data to display  Initial Impression / Assessment and Plan / UC Course  I have reviewed the triage vital signs and the nursing notes.  Pertinent labs & imaging results that were available during my care of the patient were reviewed by me and considered in my medical decision making (see chart for details).    Viral URI afebrile.  Treatment of symptoms warranted only.  Treatment per discharge instructions.  Rapid strep is negative.  Throat culture is pending.  Patient declined COVID and flu test.  Advised to follow-up with PCP if symptoms do not resolve with current treatment. Final Clinical Impressions(s) / UC Diagnoses   Final diagnoses:  Viral URI     Discharge Instructions     Gargle with warm salt water or beverages over-the-counter Chloraseptic spray to help with throat pain symptoms.  Ibuprofen Tylenol for neck or body aches.  Atrovent nasal spray for nasal symptoms and Bromfed syrup    ED Prescriptions    Medication Sig Dispense Auth. Provider   brompheniramine-pseudoephedrine-DM 30-2-10 MG/5ML syrup Take 5 mLs by mouth 4 (  four) times daily as  needed. 120 mL Bing Neighbors, FNP   ipratropium (ATROVENT) 0.03 % nasal spray Place 2 sprays into both nostrils 3 (three) times daily as needed for rhinitis. 30 mL Bing Neighbors, FNP     PDMP not reviewed this encounter.   Bing Neighbors, [place] 09/21/20 [phone]

## 2020-09-21 NOTE — Discharge Instructions (Addendum)
Gargle with warm salt water or beverages over-the-counter Chloraseptic spray to help with throat pain symptoms.  Ibuprofen Tylenol for neck or body aches.  Atrovent nasal spray for nasal symptoms and Bromfed syrup

## 2020-09-21 NOTE — ED Triage Notes (Signed)
Pt is present today with a nasal congestion and sore throat. Pt states that her sx started a few days ago. Pt states that she has been struggling with seasonal allergies for a few weeks.

## 2021-08-31 ENCOUNTER — Encounter: Payer: Self-pay | Admitting: Emergency Medicine

## 2021-08-31 ENCOUNTER — Ambulatory Visit
Admission: EM | Admit: 2021-08-31 | Discharge: 2021-08-31 | Disposition: A | Discharge status: Home / Self Care | Payer: 59 | Attending: Family Medicine | Admitting: Family Medicine

## 2021-08-31 DIAGNOSIS — J020 Streptococcal pharyngitis: Secondary | ICD-10-CM

## 2021-08-31 LAB — POCT RAPID STREP A (OFFICE): Rapid Strep A Screen: POSITIVE — AB

## 2021-08-31 MED ORDER — CEPHALEXIN 500 MG PO CAPS: 500.0000 mg | ORAL_CAPSULE | Freq: Two times a day (BID) | ORAL | 0 refills | Status: AC

## 2021-08-31 NOTE — ED Triage Notes (Signed)
Patient c/o sore throat x 2 days, body aches and chills, taken Dayquil, Sudafed and Ibuprofen.  Some cough. ?

## 2021-08-31 NOTE — Discharge Instructions (Addendum)
Your strep test is positive ? ?Take cephalexin 500 mg 1 capsule twice daily for 10 days ? ?You are contagious until about 24 hours after starting the antibiotics.  Change out your toothbrush 2 to 3 days after starting the antibiotics ?

## 2021-08-31 NOTE — ED Provider Notes (Signed)
?[facility] ? ? ? ?CSN: [id number] ?Arrival date & time: 08/31/21  0850 ? ? ?  ? ?History   ?Chief Complaint ?Chief Complaint  ?Patient presents with  ? Sore Throat  ? ? ?HPI ?Sheryl Wright is a 31 y.o. female.  ? ? ?Sore Throat ? ?Here for a 2-day history of sore throat and congestion.  Not really much cough, except clear her throat.  She did maybe have some loose stools earlier this week but no vomiting.  Possibly some fever at first ? ?Past Medical History:  ?Diagnosis Date  ? Anxiety   ? OCS syndrome (HCC)   ? PCOS (polycystic ovarian syndrome)   ? ? ?Patient Active Problem List  ? Diagnosis Date Noted  ? Pregnancy 10/25/2014  ? PCOS (polycystic ovarian syndrome)   ? ? ?Past Surgical History:  ?Procedure Laterality Date  ? ADENOIDECTOMY  1996  ? CESAREAN SECTION N/A 10/26/2014  ? Procedure: CESAREAN SECTION;  Surgeon: Richarda Overlie, MD;  Location: [facility];  Service: Obstetrics;  Laterality: N/A;  ? TONSILLECTOMY    ? tonsillitis    ? WISDOM TOOTH EXTRACTION    ? ? ?OB History   ? ? Gravida  ?2  ? Para  ?1  ? Term  ?1  ? Preterm  ?   ? AB  ?   ? Living  ?1  ?  ? ? SAB  ?   ? IAB  ?   ? Ectopic  ?   ? Multiple  ?0  ? Live Births  ?1  ?   ?  ?  ? ? ? ?Home Medications   ? ?Prior to Admission medications   ?Medication Sig Start Date End Date Taking? Authorizing Provider  ?cephALEXin (KEFLEX) 500 MG capsule Take 1 capsule (500 mg total) by mouth 2 (two) times daily for 10 days. 08/31/21 09/10/21 Yes Elizebath Wever, Janace Aris, MD  ?FLUoxetine (PROZAC) 20 MG tablet Take 20 mg by mouth every other day. Alternates between 40mg  and 20mg  to average 30mg    Yes [provider]  ?FLUoxetine (PROZAC) 40 MG capsule Take 40 mg by mouth every other day.   Yes [provider]  ?fluticasone (FLONASE) 50 MCG/ACT nasal spray Place 1 spray into both nostrils daily. 04/26/20   Hall-Potvin, , PA-C  ?hydrOXYzine (ATARAX/VISTARIL) 25 MG tablet Take 25 mg by mouth 3 (three) times daily as needed.    [provider]  ?ipratropium (ATROVENT) 0.03 % nasal spray Place 2 sprays into both nostrils 3 (three) times daily as needed for rhinitis. 09/21/20   14/8/21, FNP  ?sucralfate (CARAFATE) 1 GM/10ML suspension Take 10 mLs (1 g total) by mouth 4 (four) times daily -  with meals and at bedtime. 04/26/20   Hall-Potvin, 11/21/20, PA-C  ? ? ?Family History ?Family History  ?Problem Relation Age of Onset  ? Crohn's disease Sister   ? Heart attack Maternal Grandfather   ? Hypertension Maternal Grandfather   ? Anemia Mother   ? Urolithiasis Father   ? Cancer Maternal Grandmother   ?     breast  ? Cancer Paternal Grandmother   ?     melanoma  ? Rheum arthritis Paternal Grandfather   ? ? ?Social History ?Social History  ? ?Tobacco Use  ? Smoking status: Never  ? Smokeless tobacco: Never  ?Vaping Use  ? Vaping Use: Never used  ?Substance Use Topics  ? Alcohol use: Yes  ?  Alcohol/week: 3.0 standard drinks  ?  Types: 3 Standard drinks or equivalent per week  ?  Comment: not during pregnancy  ? Drug use: No  ? ? ? ?Allergies   ?Pertussis vaccines, Penicillins, Amoxicillin, and Tape ? ? ?Review of Systems ?Review of Systems ? ? ?Physical Exam ?Triage Vital Signs ?ED Triage Vitals  ?Enc Vitals Group  ?   BP 08/31/21 0904 100/68  ?   Pulse Rate 08/31/21 0904 95  ?   Resp 08/31/21 0904 18  ?   Temp 08/31/21 0904 98.1 ?F (36.7 ?C)  ?   Temp Source 08/31/21 0904 Oral  ?   SpO2 08/31/21 0904 94 %  ?   Weight 08/31/21 0906 162 lb 0.6 oz (73.5 kg)  ?   Height 08/31/21 0906 5\' 6"  (1.676 m)  ?   Head Circumference --   ?   Peak Flow --   ?   Pain Score 08/31/21 0906 6  ?   Pain Loc --   ?   Pain Edu? --   ?   Excl. in GC? --   ? ?No data found. ? ?Updated Vital Signs ?BP 100/68 (BP Location: Left Arm)   Pulse 95   Temp 98.4 ?F (36.9 ?C) (Oral)   Resp 18   Ht 5\' 6"  (1.676 m)   Wt 73.5 kg   LMP 08/12/2021   SpO2 94%   BMI 26.15 kg/m?  ? ?Visual Acuity ?Right Eye Distance:   ?Left Eye Distance:   ?Bilateral Distance:   ? ?Right  Eye Near:   ?Left Eye Near:    ?Bilateral Near:    ? ?Physical Exam ?Vitals reviewed.  ?Constitutional:   ?   General: She is not in acute distress. ?   Appearance: She is not toxic-appearing.  ?HENT:  ?   Right Ear: Tympanic membrane and ear canal normal.  ?   Left Ear: Tympanic membrane and ear canal normal.  ?   Nose: Nose normal.  ?   Mouth/Throat:  ?   Mouth: Mucous membranes are moist.  ?   Comments: Oropharynx has some beefy erythema and clear mucus ?Eyes:  ?   Extraocular Movements: Extraocular movements intact.  ?   Conjunctiva/sclera: Conjunctivae normal.  ?   Pupils: Pupils are equal, round, and reactive to light.  ?Cardiovascular:  ?   Rate and Rhythm: Normal rate and regular rhythm.  ?   Heart sounds: No murmur heard. ?Pulmonary:  ?   Effort: Pulmonary effort is normal. No respiratory distress.  ?   Breath sounds: No wheezing, rhonchi or rales.  ?Musculoskeletal:  ?   Cervical back: Neck supple.  ?Lymphadenopathy:  ?   Cervical: No cervical adenopathy.  ?Skin: ?   Capillary Refill: Capillary refill takes less than 2 seconds.  ?   Coloration: Skin is not jaundiced or pale.  ?Neurological:  ?   General: No focal deficit present.  ?   Mental Status: She is alert and oriented to person, place, and time.  ?Psychiatric:     ?   Behavior: Behavior normal.  ? ? ? ?UC Treatments / Results  ?Labs ?(all labs ordered are listed, but only abnormal results are displayed) ?Labs Reviewed  ?POCT RAPID STREP A (OFFICE) - Abnormal; Notable for the following components:  ?    Result Value  ? Rapid Strep A Screen Positive (*)   ? All other components within normal limits  ? ? ?EKG ? ? ?Radiology ?No results found. ? ?Procedures ?Procedures (including critical care time) ? ?  Medications Ordered in UC ?Medications - No data to display ? ?Initial Impression / Assessment and Plan / UC Course  ?I have reviewed the triage vital signs and the nursing notes. ? ?Pertinent labs & imaging results that were available during my care of  the patient were reviewed by me and considered in my medical decision making (see chart for details). ? ?  ? ?Rapid strep test is positive.  She is allergic to penicillins(rash), but has tolerated keflex in the past ?Final Clinical Impressions(s) / UC Diagnoses  ? ?Final diagnoses:  ?Strep pharyngitis  ? ? ? ?Discharge Instructions   ? ?  ?Your strep test is positive ? ?Take cephalexin 500 mg 1 capsule twice daily for 10 days ? ?You are contagious until about 24 hours after starting the antibiotics.  Change out your toothbrush 2 to 3 days after starting the antibiotics ? ? ? ? ?ED Prescriptions   ? ? Medication Sig Dispense Auth. Provider  ? cephALEXin (KEFLEX) 500 MG capsule Take 1 capsule (500 mg total) by mouth 2 (two) times daily for 10 days. 20 capsule Zenia ResidesBanister, Verenise Moulin K, MD  ? ?  ? ?PDMP not reviewed this encounter. ?  ?Zenia ResidesBanister, Leyana Whidden K, MD ?08/31/21 0935 ? ?

## 2023-12-03 DIAGNOSIS — F330 Major depressive disorder, recurrent, mild: Secondary | ICD-10-CM | POA: Insufficient documentation

## 2023-12-03 DIAGNOSIS — G479 Sleep disorder, unspecified: Secondary | ICD-10-CM | POA: Insufficient documentation

## 2023-12-03 DIAGNOSIS — F429 Obsessive-compulsive disorder, unspecified: Secondary | ICD-10-CM | POA: Insufficient documentation

## 2024-02-18 ENCOUNTER — Ambulatory Visit
Admission: RE | Admit: 2024-02-18 | Discharge: 2024-02-18 | Disposition: A | Discharge status: Home / Self Care | Source: Ambulatory Visit

## 2024-02-18 VITALS — BP 102/71 | HR 97 | Temp 98.3°F | Resp 18 | Ht 67.0 in | Wt 205.0 lb

## 2024-02-18 DIAGNOSIS — D485 Neoplasm of uncertain behavior of skin: Secondary | ICD-10-CM | POA: Insufficient documentation

## 2024-02-18 DIAGNOSIS — Z20818 Contact with and (suspected) exposure to other bacterial communicable diseases: Secondary | ICD-10-CM

## 2024-02-18 LAB — POCT RAPID STREP A (OFFICE): Rapid Strep A Screen: NEGATIVE

## 2024-02-18 MED ORDER — AZITHROMYCIN 500 MG PO TABS: 500.0000 mg | ORAL_TABLET | Freq: Every day | ORAL | 0 refills | Status: AC

## 2024-02-18 NOTE — ED Triage Notes (Signed)
 Testing for strep throat - Entered by patient

## 2024-02-18 NOTE — ED Triage Notes (Signed)
 Patient here with children (twins). Reports older children have exposed her to strep, no current symptoms. Requests symptoms.

## 2024-02-18 NOTE — ED Provider Notes (Signed)
 [facility]    CSN: [id number] Arrival date & time: 02/18/24  1008      History   Chief Complaint Chief Complaint  Patient presents with   Strep Exposure    Family of 3    HPI Sheryl Wright is a 33 y.o. female.   Discussed the use of AI scribe software for clinical note transcription with the patient, who gave verbal consent to proceed.   Patient is a mother presenting for evaluation due to strep throat exposure after two of her children tested positive. Her 47-year-old daughter and 44-year-old son were diagnosed with strep throat last night. The daughter began showing symptoms on 2 days ago, initially complaining of a hurting throat. Last night, she reported pain with swallowing. The son's symptoms started last night as well, with a milder sore throat compared to his sister. The patient herself is asymptomatic but concerned about potential infection due to her upcoming important event and her history of tonsillectomy, which makes it difficult for her to recognize early symptoms. She reports typically not feeling symptoms until the infection becomes severe. The patient also mentions a history of developing canker sores with stress. The family's two other children, Tana and Beverley (twins, age 14) are currently asymptomatic but also here for evaluation.   The following sections of the patient's history were reviewed and updated as appropriate: allergies, current medications, past family history, past medical history, past social history, past surgical history, and problem list.     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   Obsessive-compulsive disorder 12/03/2023   Sleep disturbance 12/03/2023   Cesarean delivery delivered 07/16/2019    Anxiety 12/15/2018   History of PCOS 12/15/2018   Acute non-recurrent maxillary sinusitis 06/06/2018   Pregnancy 10/25/2014   PCOS (polycystic ovarian syndrome)     Past Surgical History:  Procedure Laterality Date   ADENOIDECTOMY  1996   CESAREAN SECTION N/A 10/26/2014   Procedure: CESAREAN SECTION;  Surgeon: Charlie Croak, MD;  Location: [facility];  Service: Obstetrics;  Laterality: N/A;   TONSILLECTOMY     tonsillitis     WISDOM TOOTH EXTRACTION      OB History     Gravida  3   Para  3   Term  2   Preterm  1   AB      Living  4      SAB      IAB      Ectopic      Multiple  1   Live Births  4            Home Medications    Prior to Admission medications   Medication Sig Start Date End Date Taking? Authorizing Provider  acetaminophen  (TYLENOL ) 325 MG tablet Take 325 mg by mouth every 6 (six) hours as needed. 07/28/19  Yes [provider]  azithromycin  (ZITHROMAX ) 500 MG tablet Take 1 tablet (500 mg total) by mouth daily for 5 days. 02/18/24 02/23/24 Yes Myia Bergh, FNP  buPROPion (WELLBUTRIN XL) 150 MG 24 hr tablet Take 150 mg by mouth daily.   Yes [provider]  FLUoxetine  (PROZAC ) 20 MG tablet Take 20 mg by mouth every other day. Alternates between 40mg   and 20mg  to average 30mg    Yes [provider]  hydrOXYzine (ATARAX) 25 MG tablet Take 25 mg by mouth 3 (three) times daily. 09/04/23  Yes [provider]  hydrOXYzine (ATARAX/VISTARIL) 25 MG tablet Take 25 mg by mouth 3 (three) times daily as needed.   Yes [provider]  FLUoxetine  (PROZAC ) 20 MG capsule Take 60 mg by mouth every morning.    [provider]  FLUoxetine  (PROZAC ) 40 MG capsule Take 40 mg by mouth every other day.    [provider]  FLUoxetine  (PROZAC ) 40 MG capsule Take 60 mg by mouth daily.    [provider]  FLUoxetine  HCl (PROZAC  PO) PROzac     [provider]  fluticasone  (FLONASE ) 50 MCG/ACT  nasal spray Place 1 spray into both nostrils daily. 04/26/20   Hall-Potvin, [place], PA-C  ipratropium (ATROVENT ) 0.03 % nasal spray Place 2 sprays into both nostrils 3 (three) times daily as needed for rhinitis. 09/21/20   Arloa Suzen RAMAN, NP  sucralfate  (CARAFATE ) 1 GM/10ML suspension Take 10 mLs (1 g total) by mouth 4 (four) times daily -  with meals and at bedtime. 04/26/20   Hall-Potvin, [place], PA-C    Family History Family History  Problem Relation Age of Onset   Crohn's disease Sister    Heart attack Maternal Grandfather    Hypertension Maternal Grandfather    Anemia Mother    Urolithiasis Father    Cancer Maternal Grandmother        breast   Cancer Paternal Grandmother        melanoma   Rheum arthritis Paternal Grandfather     Social History Social History   Tobacco Use   Smoking status: Never   Smokeless tobacco: Never  Vaping Use   Vaping status: Never Used  Substance Use Topics   Alcohol use: Yes    Alcohol/week: 3.0 standard drinks of alcohol    Types: 3 Standard drinks or equivalent per week    Comment: Occ.   Drug use: No     Allergies   Pertussis vaccines, Penicillins, Pertussis vaccine, Wound dressings, Amoxicillin, Tape, and Wound dressing adhesive   Review of Systems Review of Systems  Constitutional:  Negative for fever.  HENT: Negative.  Negative for sore throat.   Respiratory: Negative.    Gastrointestinal: Negative.   All other systems reviewed and are negative.    Physical Exam Triage Vital Signs ED Triage Vitals  Encounter Vitals Group     BP 02/18/24 1038 102/71     Girls Systolic BP Percentile --      Girls Diastolic BP Percentile --      Boys Systolic BP Percentile --      Boys Diastolic BP Percentile --      Pulse Rate 02/18/24 1038 97     Resp 02/18/24 1038 18     Temp 02/18/24 1038 98.3 F (36.8 C)     Temp Source 02/18/24 1038 Oral     SpO2 02/18/24 1038 97 %     Weight 02/18/24 1034 205 lb (93 kg)     Height  02/18/24 1034 5' 7 (1.702 m)     Head Circumference --      Peak Flow --      Pain Score 02/18/24 1034 0     Pain Loc --      Pain Education --      Exclude from Growth Chart --    No data found.  Updated Vital Signs  BP 102/71 (BP Location: Right Arm)   Pulse 97   Temp 98.3 F (36.8 C) (Oral)   Resp 18   Ht 5' 7 (1.702 m)   Wt 205 lb (93 kg)   LMP 02/09/2024 (Exact Date)   SpO2 97%   BMI 32.11 kg/m   Visual Acuity Right Eye Distance:   Left Eye Distance:   Bilateral Distance:    Right Eye Near:   Left Eye Near:    Bilateral Near:     Physical Exam Vitals reviewed.  Constitutional:      General: She is awake. She is not in acute distress.    Appearance: Normal appearance. She is well-developed. She is not ill-appearing, toxic-appearing or diaphoretic.  HENT:     Head: Normocephalic.     Right Ear: Hearing normal.     Left Ear: Hearing normal.     Nose: Nose normal.     Mouth/Throat:     Mouth: Mucous membranes are moist.     Pharynx: Oropharynx is clear. Uvula midline. No pharyngeal swelling, oropharyngeal exudate, posterior oropharyngeal erythema or uvula swelling.  Eyes:     General: Vision grossly intact.     Conjunctiva/sclera: Conjunctivae normal.  Cardiovascular:     Rate and Rhythm: Normal rate and regular rhythm.     Heart sounds: Normal heart sounds.  Pulmonary:     Effort: Pulmonary effort is normal.     Breath sounds: Normal breath sounds and air entry.  Musculoskeletal:        General: Normal range of motion.     Cervical back: Normal range of motion and neck supple.  Lymphadenopathy:     Cervical: No cervical adenopathy.  Skin:    General: Skin is warm and dry.  Neurological:     General: No focal deficit present.     Mental Status: She is alert and oriented to person, place, and time.  Psychiatric:        Speech: Speech normal.        Behavior: Behavior is cooperative.      UC Treatments / Results  Labs (all labs ordered are  listed, but only abnormal results are displayed) Labs Reviewed  POCT RAPID STREP A (OFFICE) - Normal    EKG   Radiology No results found.  Procedures Procedures (including critical care time)  Medications Ordered in UC Medications - No data to display  Initial Impression / Assessment and Plan / UC Course  I have reviewed the triage vital signs and the nursing notes.  Pertinent labs & imaging results that were available during my care of the patient were reviewed by me and considered in my medical decision making (see chart for details).     The patient presents for evaluation following close household exposure to strep throat, as two of her children tested positive last night. She is currently asymptomatic but reports concern given her history of tonsillectomy, which makes early recognition of infection difficult, and her upcoming important event. She is afebrile, nontoxic, in no acute distress, and physical exam is unremarkable. Rapid strep testing is negative; however, given confirmed in-home exposure and patient risk factors, prophylactic treatment is warranted. Due to her penicillin allergy, azithromycin  was prescribed. Supportive care measures reviewed and patient advised to monitor for new or worsening symptoms.  Today's evaluation has revealed no signs of a dangerous process. Discussed diagnosis with patient and/or guardian. Patient and/or guardian aware of their diagnosis, possible red flag symptoms to watch out for and need  for close follow up. Patient and/or guardian understands verbal and written discharge instructions. Patient and/or guardian comfortable with plan and disposition.  Patient and/or guardian has a clear mental status at this time, good insight into illness (after discussion and teaching) and has clear judgment to make decisions regarding their care  Documentation was completed with the aid of voice recognition software. Transcription may contain typographical  errors.  Final Clinical Impressions(s) / UC Diagnoses   Final diagnoses:  Strep throat exposure     Discharge Instructions      You were seen today because two of your children recently tested positive for strep throat. Your rapid strep test was negative, and your exam today is reassuring. However, because you have had close exposure at home and you may not notice early symptoms easily due to your history of tonsillectomy, we are starting you on an antibiotic to help prevent infection. Since you are allergic to penicillin, you will be taking azithromycin . Please take it exactly as prescribed and finish the entire course. In addition to your medicine, supportive care will help you stay well. Drink plenty of fluids, eat balanced meals, and rest as needed. Good handwashing and avoiding sharing cups or utensils will help reduce the spread of strep in your household. Follow up with your primary care provider if you develop symptoms such as sore throat, fever, difficulty swallowing, or if you have any concerns about your recovery. Go to the emergency room right away if you develop trouble breathing, severe swelling of the throat, or any other concerning symptoms.     ED Prescriptions     Medication Sig Dispense Auth. Provider   azithromycin  (ZITHROMAX ) 500 MG tablet Take 1 tablet (500 mg total) by mouth daily for 5 days. 5 tablet Iola Lukes, FNP      PDMP not reviewed this encounter.   Iola Lukes, [place] 02/18/24 1201

## 2024-02-18 NOTE — Discharge Instructions (Addendum)
 You were seen today because two of your children recently tested positive for strep throat. Your rapid strep test was negative, and your exam today is reassuring. However, because you have had close exposure at home and you may not notice early symptoms easily due to your history of tonsillectomy, we are starting you on an antibiotic to help prevent infection. Since you are allergic to penicillin, you will be taking azithromycin . Please take it exactly as prescribed and finish the entire course. In addition to your medicine, supportive care will help you stay well. Drink plenty of fluids, eat balanced meals, and rest as needed. Good handwashing and avoiding sharing cups or utensils will help reduce the spread of strep in your household. Follow up with your primary care provider if you develop symptoms such as sore throat, fever, difficulty swallowing, or if you have any concerns about your recovery. Go to the emergency room right away if you develop trouble breathing, severe swelling of the throat, or any other concerning symptoms.

## 2024-07-23 ENCOUNTER — Ambulatory Visit

## 2024-09-06 ENCOUNTER — Ambulatory Visit: Admitting: Family Medicine

## 2024-09-10 ENCOUNTER — Ambulatory Visit: Admitting: Family Medicine
